# Patient Record
Sex: Female | Born: 1980
Health system: Southern US, Community
[De-identification: ages and names within clinical notes are randomized; demographics above are authoritative.]

---

## 2019-12-28 ENCOUNTER — Other Ambulatory Visit: Payer: Self-pay

## 2019-12-28 ENCOUNTER — Emergency Department (HOSPITAL_COMMUNITY)
Admission: EM | Admit: 2019-12-28 | Discharge: 2019-12-28 | Disposition: A | Discharge status: Home / Self Care | Payer: Self-pay | Attending: Emergency Medicine | Admitting: Emergency Medicine

## 2019-12-28 DIAGNOSIS — M791 Myalgia, unspecified site: Secondary | ICD-10-CM | POA: Insufficient documentation

## 2019-12-28 DIAGNOSIS — Z5321 Procedure and treatment not carried out due to patient leaving prior to being seen by health care provider: Secondary | ICD-10-CM | POA: Insufficient documentation

## 2019-12-28 NOTE — ED Triage Notes (Signed)
Pt here from home for evaluation of loss of taste and smell, sore throat, and generalized body aches x 2 days. Sister here with same symptoms.

## 2019-12-28 NOTE — ED Notes (Signed)
No response for room or reassessment x3

## 2021-03-14 ENCOUNTER — Emergency Department (HOSPITAL_COMMUNITY): Payer: Self-pay

## 2021-03-14 ENCOUNTER — Emergency Department (HOSPITAL_COMMUNITY)
Admission: EM | Admit: 2021-03-14 | Discharge: 2021-03-14 | Disposition: A | Discharge status: Home / Self Care | Payer: Self-pay | Attending: Emergency Medicine | Admitting: Emergency Medicine

## 2021-03-14 ENCOUNTER — Other Ambulatory Visit: Payer: Self-pay

## 2021-03-14 ENCOUNTER — Encounter (HOSPITAL_COMMUNITY): Payer: Self-pay | Admitting: Emergency Medicine

## 2021-03-14 DIAGNOSIS — R102 Pelvic and perineal pain: Secondary | ICD-10-CM | POA: Insufficient documentation

## 2021-03-14 DIAGNOSIS — N12 Tubulo-interstitial nephritis, not specified as acute or chronic: Secondary | ICD-10-CM | POA: Insufficient documentation

## 2021-03-14 DIAGNOSIS — M549 Dorsalgia, unspecified: Secondary | ICD-10-CM

## 2021-03-14 DIAGNOSIS — I722 Aneurysm of renal artery: Secondary | ICD-10-CM | POA: Insufficient documentation

## 2021-03-14 LAB — URINALYSIS, ROUTINE W REFLEX MICROSCOPIC
Bilirubin Urine: NEGATIVE
Glucose, UA: NEGATIVE mg/dL
Ketones, ur: NEGATIVE mg/dL
Nitrite: POSITIVE — AB
Protein, ur: NEGATIVE mg/dL
Specific Gravity, Urine: 1.023 (ref 1.005–1.030)
pH: 6 (ref 5.0–8.0)

## 2021-03-14 LAB — COMPREHENSIVE METABOLIC PANEL
ALT: 10 U/L (ref 0–44)
AST: 15 U/L (ref 15–41)
Albumin: 3.9 g/dL (ref 3.5–5.0)
Alkaline Phosphatase: 70 U/L (ref 38–126)
Anion gap: 8 (ref 5–15)
BUN: 14 mg/dL (ref 6–20)
CO2: 22 mmol/L (ref 22–32)
Calcium: 9.3 mg/dL (ref 8.9–10.3)
Chloride: 110 mmol/L (ref 98–111)
Creatinine, Ser: 0.81 mg/dL (ref 0.44–1.00)
GFR, Estimated: >60 mL/min (ref >60)
Glucose, Bld: 107 mg/dL — ABNORMAL HIGH (ref 70–99)
Potassium: 3.5 mmol/L (ref 3.5–5.1)
Sodium: 140 mmol/L (ref 135–145)
Total Bilirubin: 0.4 mg/dL (ref 0.3–1.2)
Total Protein: 7.6 g/dL (ref 6.5–8.1)

## 2021-03-14 LAB — CBC
HCT: 38.2 % (ref 36.0–46.0)
Hemoglobin: 12.6 g/dL (ref 12.0–15.0)
MCH: 27.8 pg (ref 26.0–34.0)
MCHC: 33 g/dL (ref 30.0–36.0)
MCV: 84.1 fL (ref 80.0–100.0)
Platelets: 203 10*3/uL (ref 150–400)
RBC: 4.54 MIL/uL (ref 3.87–5.11)
RDW: 14.1 % (ref 11.5–15.5)
WBC: 10.7 10*3/uL — ABNORMAL HIGH (ref 4.0–10.5)
nRBC: 0 % (ref 0.0–0.2)

## 2021-03-14 LAB — POC URINE PREG, ED: Preg Test, Ur: NEGATIVE

## 2021-03-14 LAB — LIPASE, BLOOD: Lipase: 30 U/L (ref 11–51)

## 2021-03-14 MED ORDER — ONDANSETRON 4 MG PO TBDP
4.0000 mg | ORAL_TABLET | Freq: Once | ORAL | Status: AC
  Administered 2021-03-14: 4 mg via ORAL
  Filled 2021-03-14: qty 1

## 2021-03-14 MED ORDER — LIDOCAINE HCL (PF) 1 % IJ SOLN
5.0000 mL | Freq: Once | INTRAMUSCULAR | Status: AC
  Administered 2021-03-14: 2.1 mL via INTRADERMAL
  Filled 2021-03-14: qty 30

## 2021-03-14 MED ORDER — CEFTRIAXONE SODIUM 1 G IJ SOLR
1.0000 g | Freq: Once | INTRAMUSCULAR | Status: AC
  Administered 2021-03-14: 1 g via INTRAMUSCULAR
  Filled 2021-03-14: qty 10

## 2021-03-14 MED ORDER — HYDROMORPHONE HCL 1 MG/ML IJ SOLN
1.0000 mg | Freq: Once | INTRAMUSCULAR | Status: AC
  Administered 2021-03-14: 1 mg via INTRAMUSCULAR
  Filled 2021-03-14: qty 1

## 2021-03-14 MED ORDER — CEPHALEXIN 500 MG PO CAPS: 500.0000 mg | ORAL_CAPSULE | Freq: Four times a day (QID) | ORAL | 0 refills | Status: DC

## 2021-03-14 NOTE — Discharge Instructions (Addendum)
1. Medications: Keflex, usual home medications °2. Treatment: rest, drink plenty of fluids, take medications as prescribed °3. Follow Up: Please followup with your primary doctor in 3 days for discussion of your diagnoses and further evaluation after today's visit; if you do not have a primary care doctor use the resource guide provided to find one; return to the ER for fevers, persistent vomiting, worsening abdominal pain or other concerning symptoms. ° °

## 2021-03-14 NOTE — ED Triage Notes (Signed)
Pt BIB EMS from work c/o generalized abdominal pain that radiates to right flank/lower back area x 2 days that has progressively gotten worse. Denies N/V/D, urinary changes.

## 2021-03-14 NOTE — ED Provider Notes (Signed)
[facility]-EMERGENCY DEPT Provider Note   CSN: [id number] Arrival date & time: 03/14/21  0105     History Chief Complaint  Patient presents with   Abdominal Pain    Courtney Gutierrez is a 40 y.o. female presents to the Emergency Department complaining of gradual, persistent, progressively worsening right sided back and flank pain onset 3 days ago.  Rates pain at a 10/10.  Pain radiates into the RLQ.  Movement makes the pain worse.  Denies N/V/D.  Pt denies hx of kidney stones. No urinary or vaginal symptoms.  No menses in 3 years.  No fever, chills.  No falls or known trauma.  No treatments prior to arrival.  The history is provided by the patient and medical records. No language interpreter was used.      History reviewed. No pertinent past medical history.  There are no problems to display for this patient.   Past Surgical History:  Procedure Laterality Date   CESAREAN SECTION       OB History   No obstetric history on file.     No family history on file.     Home Medications Prior to Admission medications   Medication Sig Start Date End Date Taking? Authorizing Provider  cephALEXin (KEFLEX) 500 MG capsule Take 1 capsule (500 mg total) by mouth 4 (four) times daily. 03/14/21  Yes Jared Whorley, Dahlia Client, PA-C    Allergies    Patient has no known allergies.  Review of Systems   Review of Systems  Constitutional:  Negative for appetite change, diaphoresis, fatigue, fever and unexpected weight change.  HENT:  Negative for mouth sores.   Eyes:  Negative for visual disturbance.  Respiratory:  Negative for cough, chest tightness, shortness of breath and wheezing.   Cardiovascular:  Negative for chest pain.  Gastrointestinal:  Positive for abdominal pain. Negative for constipation, diarrhea, nausea and vomiting.  Endocrine: Negative for polydipsia, polyphagia and polyuria.  Genitourinary:  Positive for flank pain. Negative for dysuria, frequency,  hematuria and urgency.  Musculoskeletal:  Positive for back pain. Negative for neck stiffness.  Skin:  Negative for rash.  Allergic/Immunologic: Negative for immunocompromised state.  Neurological:  Negative for syncope, light-headedness and headaches.  Hematological:  Does not bruise/bleed easily.  Psychiatric/Behavioral:  Negative for sleep disturbance. The patient is not nervous/anxious.    Physical Exam Updated Vital Signs BP (!) 135/106 (BP Location: Left Arm)   Pulse 72   Temp 98.5 F (36.9 C) (Oral)   Resp 18   SpO2 100%   Physical Exam Vitals and nursing note reviewed.  Constitutional:      General: She is not in acute distress.    Appearance: She is not diaphoretic.     Comments: Rocking in pain and tearful  HENT:     Head: Normocephalic.  Eyes:     General: No scleral icterus.    Conjunctiva/sclera: Conjunctivae normal.  Cardiovascular:     Rate and Rhythm: Normal rate and regular rhythm.     Pulses: Normal pulses.          Radial pulses are 2+ on the right side and 2+ on the left side.  Pulmonary:     Effort: No tachypnea, accessory muscle usage, prolonged expiration, respiratory distress or retractions.     Breath sounds: No stridor.     Comments: Equal chest rise. No increased work of breathing. Abdominal:     General: There is no distension.     Palpations: Abdomen is soft.  Tenderness: There is abdominal tenderness in the right lower quadrant and suprapubic area. There is right CVA tenderness and left CVA tenderness. There is no guarding or rebound.  Musculoskeletal:     Cervical back: Normal range of motion.     Thoracic back: Normal.     Lumbar back: Tenderness present.     Comments: Moves all extremities equally and without difficulty. Tenderness to palpation of the right paraspinal muscles of the L-spine  Skin:    General: Skin is warm and dry.     Capillary Refill: Capillary refill takes less than 2 seconds.  Neurological:     Mental Status:  She is alert.     GCS: GCS eye subscore is 4. GCS verbal subscore is 5. GCS motor subscore is 6.     Comments: Speech is clear and goal oriented.  Psychiatric:        Mood and Affect: Mood normal.    ED Results / Procedures / Treatments   Labs (all labs ordered are listed, but only abnormal results are displayed) Labs Reviewed  COMPREHENSIVE METABOLIC PANEL - Abnormal; Notable for the following components:      Result Value   Glucose, Bld 107 (*)    All other components within normal limits  CBC - Abnormal; Notable for the following components:   WBC 10.7 (*)    All other components within normal limits  URINALYSIS, ROUTINE W REFLEX MICROSCOPIC - Abnormal; Notable for the following components:   Hgb urine dipstick SMALL (*)    Nitrite POSITIVE (*)    Leukocytes,Ua MODERATE (*)    Bacteria, UA RARE (*)    All other components within normal limits  URINE CULTURE  LIPASE, BLOOD  I-STAT BETA HCG BLOOD, ED ([facility], [facility], [facility] ONLY)  POC URINE PREG, ED      Radiology CT L-SPINE NO CHARGE  Result Date: 03/14/2021 CLINICAL DATA:  Right flank and low back pain EXAM: CT LUMBAR SPINE WITHOUT CONTRAST TECHNIQUE: Multidetector CT imaging of the lumbar spine was performed without intravenous contrast administration. Multiplanar CT image reconstructions were also generated. COMPARISON:  None. FINDINGS: Segmentation: 5 lumbar type vertebrae. Alignment: Levoscoliosis with apex at L2 Vertebrae: No acute fracture or focal pathologic process. Disc levels: There is no bony spinal canal stenosis. There is moderate-to-severe facet arthrosis of the lower lumbar spine. Moderate to severe right L2 and L3 foraminal stenosis and left L4 and L5 foraminal stenosis. IMPRESSION: 1. No acute fracture or static subluxation of the lumbar spine. 2. Levoscoliosis with apex at L2. 3. Moderate to severe right L2, right L3, left L4 and left L5 neural foraminal stenosis. Electronically Signed   By: Deatra Robinson M.D.   On:  03/14/2021 03:32   CT Renal Stone Study  Result Date: 03/14/2021 CLINICAL DATA:  Right flank pain, low back pain EXAM: CT ABDOMEN AND PELVIS WITHOUT CONTRAST TECHNIQUE: Multidetector CT imaging of the abdomen and pelvis was performed following the standard protocol without IV contrast. COMPARISON:  None. FINDINGS: Lower chest: No acute abnormality. Hepatobiliary: No focal liver abnormality is seen. No gallstones, gallbladder wall thickening, or biliary dilatation. Pancreas: Unremarkable Spleen: Unremarkable Adrenals/Urinary Tract: The adrenal glands are unremarkable. The kidneys are normal in size and position. Rim calcified rounded structure within the left renal hilum may represent a a renal artery aneurysm measuring 12 mm in maximal diameter. The kidneys are otherwise unremarkable. Specifically, there is no hydronephrosis, no intrarenal or ureteral calculi are identified, and there is no significant perinephric stranding or  perinephric fluid collections. The bladder is unremarkable. Stomach/Bowel: The stomach, small bowel, and and large bowel are unremarkable. Appendix normal. No evidence of obstruction or focal inflammation. No free intraperitoneal gas or fluid. Vascular/Lymphatic: No significant vascular findings are present. No enlarged abdominal or pelvic lymph nodes. Reproductive: Uterus and bilateral adnexa are unremarkable. Other: No abdominal wall hernia.  Rectum unremarkable. Musculoskeletal: Advanced degenerative changes are seen within the lumbar spine with superimposed lumbar levoscoliosis, apex left at L3. No acute bone abnormality. No lytic or blastic bone lesion. IMPRESSION: No acute intra-abdominal pathology. No definite radiographic explanation for the patient's reported symptoms. 12 mm partially rim calcified structure within the left renal hilum possibly representing a renal artery aneurysm. This could be confirmed with nonemergent CT arteriography. Lumbar levoscoliosis with superimposed  advanced degenerative change. Electronically Signed   By: Helyn Numbers MD   On: 03/14/2021 03:33    Procedures Procedures   Medications Ordered in ED Medications  HYDROmorphone (DILAUDID) injection 1 mg (1 mg Intramuscular Given 03/14/21 0229)  ondansetron (ZOFRAN-ODT) disintegrating tablet 4 mg (4 mg Oral Given 03/14/21 0230)  cefTRIAXone (ROCEPHIN) injection 1 g (1 g Intramuscular Given 03/14/21 0524)  lidocaine (PF) (XYLOCAINE) 1 % injection 5 mL (2.1 mLs Intradermal Given 03/14/21 0524)    ED Course  I have reviewed the triage vital signs and the nursing notes.  Pertinent labs & imaging results that were available during my care of the patient were reviewed by me and considered in my medical decision making (see chart for details).    MDM Rules/Calculators/A&P                           Patient presents with right-sided flank and lower abdominal pain.  Patient was severe pain, tearful and rocking.  Labs and imaging pending.  Suspect nephrolithiasis.  Patient has no history of same.  Denies urinary symptoms.  Work-up with mild leukocytosis.  Urinalysis consistent with urinary tract infection.  CT scan without evidence of nephrolithiasis.  Urine culture sent.  Patient given Rocephin here in the emergency department and discharged home with Keflex.  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  Patient referred to urology for further evaluation and additional work-up.  Patient states understanding and is in agreement with this plan.  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

## 2021-03-15 ENCOUNTER — Encounter (HOSPITAL_COMMUNITY): Payer: Self-pay | Admitting: Emergency Medicine

## 2021-03-15 ENCOUNTER — Other Ambulatory Visit: Payer: Self-pay

## 2021-03-15 ENCOUNTER — Emergency Department (HOSPITAL_COMMUNITY)
Admission: EM | Admit: 2021-03-15 | Discharge: 2021-03-15 | Disposition: A | Discharge status: Home / Self Care | Payer: Self-pay | Attending: Student | Admitting: Student

## 2021-03-15 DIAGNOSIS — R109 Unspecified abdominal pain: Secondary | ICD-10-CM | POA: Insufficient documentation

## 2021-03-15 DIAGNOSIS — N12 Tubulo-interstitial nephritis, not specified as acute or chronic: Secondary | ICD-10-CM

## 2021-03-15 LAB — URINALYSIS, ROUTINE W REFLEX MICROSCOPIC
Bilirubin Urine: NEGATIVE
Glucose, UA: NEGATIVE mg/dL
Hgb urine dipstick: NEGATIVE
Ketones, ur: 5 mg/dL — AB
Nitrite: NEGATIVE
Protein, ur: 30 mg/dL — AB
Specific Gravity, Urine: 1.021 (ref 1.005–1.030)
Squamous Epithelial / HPF: >50 — ABNORMAL HIGH (ref 0–5)
pH: 5 (ref 5.0–8.0)

## 2021-03-15 LAB — CBC WITH DIFFERENTIAL/PLATELET
Abs Immature Granulocytes: 0.03 10*3/uL (ref 0.00–0.07)
Basophils Absolute: 0 10*3/uL (ref 0.0–0.1)
Basophils Relative: 0 %
Eosinophils Absolute: 0.2 10*3/uL (ref 0.0–0.5)
Eosinophils Relative: 2 %
HCT: 39.8 % (ref 36.0–46.0)
Hemoglobin: 12.8 g/dL (ref 12.0–15.0)
Immature Granulocytes: 0 %
Lymphocytes Relative: 34 %
Lymphs Abs: 2.9 10*3/uL (ref 0.7–4.0)
MCH: 27.8 pg (ref 26.0–34.0)
MCHC: 32.2 g/dL (ref 30.0–36.0)
MCV: 86.5 fL (ref 80.0–100.0)
Monocytes Absolute: 0.7 10*3/uL (ref 0.1–1.0)
Monocytes Relative: 8 %
Neutro Abs: 4.8 10*3/uL (ref 1.7–7.7)
Neutrophils Relative %: 56 %
Platelets: 206 10*3/uL (ref 150–400)
RBC: 4.6 MIL/uL (ref 3.87–5.11)
RDW: 14.2 % (ref 11.5–15.5)
WBC: 8.7 10*3/uL (ref 4.0–10.5)
nRBC: 0 % (ref 0.0–0.2)

## 2021-03-15 LAB — COMPREHENSIVE METABOLIC PANEL
ALT: 11 U/L (ref 0–44)
AST: 18 U/L (ref 15–41)
Albumin: 3.7 g/dL (ref 3.5–5.0)
Alkaline Phosphatase: 69 U/L (ref 38–126)
Anion gap: 8 (ref 5–15)
BUN: 10 mg/dL (ref 6–20)
CO2: 26 mmol/L (ref 22–32)
Calcium: 8.8 mg/dL — ABNORMAL LOW (ref 8.9–10.3)
Chloride: 107 mmol/L (ref 98–111)
Creatinine, Ser: 0.65 mg/dL (ref 0.44–1.00)
GFR, Estimated: >60 mL/min (ref >60)
Glucose, Bld: 82 mg/dL (ref 70–99)
Potassium: 3.7 mmol/L (ref 3.5–5.1)
Sodium: 141 mmol/L (ref 135–145)
Total Bilirubin: 0.5 mg/dL (ref 0.3–1.2)
Total Protein: 7.5 g/dL (ref 6.5–8.1)

## 2021-03-15 LAB — LIPASE, BLOOD: Lipase: 32 U/L (ref 11–51)

## 2021-03-15 MED ORDER — DICLOFENAC SODIUM 1 % EX GEL
2.0000 g | Freq: Four times a day (QID) | CUTANEOUS | Status: DC
Start: 2021-03-15 — End: 2022-01-09

## 2021-03-15 MED ORDER — KETOROLAC TROMETHAMINE 15 MG/ML IJ SOLN
15.0000 mg | Freq: Once | INTRAMUSCULAR | Status: AC
  Administered 2021-03-15: 15 mg via INTRAVENOUS
  Filled 2021-03-15: qty 1

## 2021-03-15 MED ORDER — ACETAMINOPHEN 325 MG PO TABS
650.0000 mg | ORAL_TABLET | Freq: Once | ORAL | Status: AC
  Administered 2021-03-15: 650 mg via ORAL
  Filled 2021-03-15: qty 2

## 2021-03-15 MED ORDER — LIDOCAINE 5 % EX PTCH
1.0000 | MEDICATED_PATCH | CUTANEOUS | Status: DC
  Administered 2021-03-15: 1 via TRANSDERMAL
  Filled 2021-03-15: qty 1

## 2021-03-15 NOTE — ED Provider Notes (Signed)
[facility]-EMERGENCY DEPT Provider Note   CSN: [id number] Arrival date & time: 03/15/21  1719     History Chief Complaint  Patient presents with   Flank Pain    Courtney Gutierrez is a 40 y.o. female who presents to the emergency department for evaluation of right flank pain.  Patient was seen in the emergency department yesterday and had an extensive work-up revealing pyelonephritis is the source of her flank pain with a negative CT stone study.  She states that she has been taking her outpatient antibiotics and has not had any recurrent fever, nausea, vomiting, chest pain, shortness of breath or other systemic symptoms.  She states that her pain is in the right flank and returns due to uncontrolled pain.   Flank Pain Pertinent negatives include no chest pain, no abdominal pain and no shortness of breath.      History reviewed. No pertinent past medical history.  There are no problems to display for this patient.   Past Surgical History:  Procedure Laterality Date   CESAREAN SECTION       OB History   No obstetric history on file.     No family history on file.     Home Medications Prior to Admission medications   Medication Sig Start Date End Date Taking? Authorizing Provider  cephALEXin (KEFLEX) 500 MG capsule Take 1 capsule (500 mg total) by mouth 4 (four) times daily. 03/14/21   Muthersbaugh, Dahlia Client, PA-C    Allergies    Patient has no known allergies.  Review of Systems   Review of Systems  Constitutional:  Negative for chills and fever.  HENT:  Negative for ear pain and sore throat.   Eyes:  Negative for pain and visual disturbance.  Respiratory:  Negative for cough and shortness of breath.   Cardiovascular:  Negative for chest pain and palpitations.  Gastrointestinal:  Negative for abdominal pain and vomiting.  Genitourinary:  Positive for flank pain. Negative for dysuria and hematuria.  Musculoskeletal:  Negative for arthralgias and  back pain.  Skin:  Negative for color change and rash.  Neurological:  Negative for seizures and syncope.  All other systems reviewed and are negative.  Physical Exam Updated Vital Signs BP (!) 175/104   Pulse (!) 55   Temp 98.6 F (37 C) (Oral)   Resp 18   SpO2 100%   Physical Exam  ED Results / Procedures / Treatments   Labs (all labs ordered are listed, but only abnormal results are displayed) Labs Reviewed  COMPREHENSIVE METABOLIC PANEL - Abnormal; Notable for the following components:      Result Value   Calcium 8.8 (*)    All other components within normal limits  URINALYSIS, ROUTINE W REFLEX MICROSCOPIC - Abnormal; Notable for the following components:   APPearance TURBID (*)    Ketones, ur 5 (*)    Protein, ur 30 (*)    Leukocytes,Ua MODERATE (*)    Bacteria, UA MANY (*)    Squamous Epithelial / LPF >50 (*)    All other components within normal limits  CBC WITH DIFFERENTIAL/PLATELET  LIPASE, BLOOD    EKG None  Radiology CT L-SPINE NO CHARGE  Result Date: 03/14/2021 CLINICAL DATA:  Right flank and low back pain EXAM: CT LUMBAR SPINE WITHOUT CONTRAST TECHNIQUE: Multidetector CT imaging of the lumbar spine was performed without intravenous contrast administration. Multiplanar CT image reconstructions were also generated. COMPARISON:  None. FINDINGS: Segmentation: 5 lumbar type vertebrae. Alignment: Levoscoliosis with apex  at L2 Vertebrae: No acute fracture or focal pathologic process. Disc levels: There is no bony spinal canal stenosis. There is moderate-to-severe facet arthrosis of the lower lumbar spine. Moderate to severe right L2 and L3 foraminal stenosis and left L4 and L5 foraminal stenosis. IMPRESSION: 1. No acute fracture or static subluxation of the lumbar spine. 2. Levoscoliosis with apex at L2. 3. Moderate to severe right L2, right L3, left L4 and left L5 neural foraminal stenosis. Electronically Signed   By: Deatra Robinson M.D.   On: 03/14/2021 03:32   CT  Renal Stone Study  Result Date: 03/14/2021 CLINICAL DATA:  Right flank pain, low back pain EXAM: CT ABDOMEN AND PELVIS WITHOUT CONTRAST TECHNIQUE: Multidetector CT imaging of the abdomen and pelvis was performed following the standard protocol without IV contrast. COMPARISON:  None. FINDINGS: Lower chest: No acute abnormality. Hepatobiliary: No focal liver abnormality is seen. No gallstones, gallbladder wall thickening, or biliary dilatation. Pancreas: Unremarkable Spleen: Unremarkable Adrenals/Urinary Tract: The adrenal glands are unremarkable. The kidneys are normal in size and position. Rim calcified rounded structure within the left renal hilum may represent a a renal artery aneurysm measuring 12 mm in maximal diameter. The kidneys are otherwise unremarkable. Specifically, there is no hydronephrosis, no intrarenal or ureteral calculi are identified, and there is no significant perinephric stranding or perinephric fluid collections. The bladder is unremarkable. Stomach/Bowel: The stomach, small bowel, and and large bowel are unremarkable. Appendix normal. No evidence of obstruction or focal inflammation. No free intraperitoneal gas or fluid. Vascular/Lymphatic: No significant vascular findings are present. No enlarged abdominal or pelvic lymph nodes. Reproductive: Uterus and bilateral adnexa are unremarkable. Other: No abdominal wall hernia.  Rectum unremarkable. Musculoskeletal: Advanced degenerative changes are seen within the lumbar spine with superimposed lumbar levoscoliosis, apex left at L3. No acute bone abnormality. No lytic or blastic bone lesion. IMPRESSION: No acute intra-abdominal pathology. No definite radiographic explanation for the patient's reported symptoms. 12 mm partially rim calcified structure within the left renal hilum possibly representing a renal artery aneurysm. This could be confirmed with nonemergent CT arteriography. Lumbar levoscoliosis with superimposed advanced degenerative  change. Electronically Signed   By: Helyn Numbers MD   On: 03/14/2021 03:33    Procedures Procedures   Medications Ordered in ED Medications  lidocaine (LIDODERM) 5 % 1 patch (has no administration in time range)  ketorolac (TORADOL) 15 MG/ML injection 15 mg (has no administration in time range)  acetaminophen (TYLENOL) tablet 650 mg (650 mg Oral Given 03/15/21 1809)    ED Course  I have reviewed the triage vital signs and the nursing notes.  Pertinent labs & imaging results that were available during my care of the patient were reviewed by me and considered in my medical decision making (see chart for details).    MDM Rules/Calculators/A&P                           Patient seen the emergency department for evaluation of flank pain.  Physical exam reveals tenderness over the right flank but is otherwise unremarkable.  Laboratory evaluation again reveals moderate leuk esterase with 21-50 white blood cells, 21-50 red blood cells and many bacteria, greater than 50 squamous epithelial cells.  Laboratory evaluation otherwise unremarkable.  Patient was given Toradol and lidocaine patch.  On reevaluation, patient's pain well controlled and she was discharged with instructions to take NSAID therapy scheduled 3 times daily, purchase lidocaine patches and Voltaren gel from the pharmacy.  She was given return precautions of which she voiced understanding and she was discharged. Final Clinical Impression(s) / ED Diagnoses Final diagnoses:  None    Rx / DC Orders ED Discharge Orders     None        Tyrus Wilms, Wyn Forster, MD 03/15/21 2328

## 2021-03-15 NOTE — ED Provider Notes (Signed)
Emergency Medicine Provider Triage Evaluation Note  Courtney Gutierrez , a 40 y.o. female  was evaluated in triage.  Pt complains of right flank pain.  Pain has been present over the last 4 days.  Patient went to emergency department yesterday and had a full work-up.  Patient denies any change in pain from yesterday.  Patient reports taking Advil earlier today with no relief of symptoms.  Patient denies any fevers, chills, nausea, vomiting, dysuria, hematuria, vaginal pain, vaginal bleeding, vaginal discharge.  Patient denies any recent falls or injuries.  LMP 4 years prior.  Review of Systems  Positive: Right flank pain, urinary frequency Negative: vers, chills, nausea, vomiting, dysuria, hematuria, vaginal pain, vaginal bleeding, vaginal discharge  Physical Exam  BP 130/81 (BP Location: Left Arm)   Pulse 60   Temp 98.3 F (36.8 C) (Oral)   Resp 16   SpO2 98%  Gen:   Awake, no distress   Resp:  Normal effort  MSK:   Moves extremities without difficulty  Other:  Abdomen soft, nondistended, nontender, no guarding, no rebound tenderness.  Positive right CVA tenderness.  Medical Decision Making  Medically screening exam initiated at 5:42 PM.  Appropriate orders placed.  Courtney Gutierrez was informed that the remainder of the evaluation will be completed by another provider, this initial triage assessment does not replace that evaluation, and the importance of remaining in the ED until their evaluation is complete.  The patient appears stable so that the remainder of the work up may be completed by another provider.      Haskel Schroeder, PA-C 03/15/21 1744    Glendora Score, MD 03/15/21 [id number]

## 2021-03-15 NOTE — ED Triage Notes (Signed)
Pt c/o flank pain and UTI. Pt was seen yesterday in ED for same and was discharged home with abx. Pt reports she returned to ED bc pain has gotten worse.

## 2021-03-15 NOTE — Discharge Instructions (Addendum)
You were seen in the emergency department for evaluation of flank pain.  Your flank pain is due to a kidney infection called pyelonephritis.  Please continue to take your antibiotics as scheduled that you received yesterday.  I will give you a prescription for a topical medicine called Voltaren gel that you can rub over the area of maximal pain.  Another option will be to use a lidocaine patch called Salonpas that you can pick up over-the-counter at any local pharmacy.  Please take 600 mg ibuprofen with breakfast lunch and dinner for the next 4 days scheduled.  Please take the medication even if you are not in pain.  Return the emergency department if you have new or worsening fevers, vomiting, chest pain, shortness of breath, abdominal pain, blood in your urine or any other concerning symptoms.

## 2021-03-15 NOTE — ED Notes (Signed)
Pt unable to urinate at this time.  

## 2021-03-16 LAB — URINE CULTURE: Culture: >=100000 — AB

## 2021-03-17 ENCOUNTER — Telehealth: Payer: Self-pay | Admitting: Emergency Medicine

## 2021-03-17 NOTE — Telephone Encounter (Signed)
Post ED Visit - Positive Culture Follow-up  Culture report reviewed by antimicrobial stewardship pharmacist: [facility] []  , Pharm.D. []  Enzo Bi, Pharm.D., BCPS AQ-ID []  , Pharm.D., BCPS []  Celedonio Miyamoto, Pharm.D., BCPS []  [place], Garvin Fila.D., BCPS, AAHIVP []  , Pharm.D., BCPS, AAHIVP []  Georgina Pillion, PharmD, BCPS []  , PharmD, BCPS []  [place], PharmD, BCPS []  [address], PharmD []  , PharmD, BCPS []  Estella Husk, PharmD  Pharmacy Team []  Lysle Pearl, PharmD []  , PharmD []  Phillips Climes, PharmD []  , Rph []  Agapito Games) , PharmD []  Verlan Friends, PharmD []  , PharmD []  Mervyn Gay, PharmD []  , PharmD []  Vinnie Level, PharmD []  [facility], PharmD []  , PharmD []  Len Childs, PharmD   Positive urine culture Treated with rocephin and keflex, organism sensitive to the same and no further patient follow-up is required at this time.  03/17/2021, 9:27 AM

## 2021-03-25 ENCOUNTER — Ambulatory Visit: Payer: Self-pay | Admitting: Nurse Practitioner

## 2021-03-25 ENCOUNTER — Ambulatory Visit: Payer: Self-pay

## 2021-04-08 ENCOUNTER — Emergency Department (HOSPITAL_COMMUNITY)
Admission: EM | Admit: 2021-04-08 | Discharge: 2021-04-08 | Disposition: A | Discharge status: Home / Self Care | Payer: Self-pay | Attending: Student | Admitting: Student

## 2021-04-08 ENCOUNTER — Emergency Department (HOSPITAL_COMMUNITY): Payer: Self-pay

## 2021-04-08 ENCOUNTER — Other Ambulatory Visit: Payer: Self-pay

## 2021-04-08 DIAGNOSIS — M1712 Unilateral primary osteoarthritis, left knee: Secondary | ICD-10-CM | POA: Insufficient documentation

## 2021-04-08 MED ORDER — CELECOXIB 200 MG PO CAPS: 200.0000 mg | ORAL_CAPSULE | Freq: Two times a day (BID) | ORAL | 0 refills | Status: DC

## 2021-04-08 NOTE — ED Provider Notes (Signed)
[facility]-EMERGENCY DEPT Provider Note   CSN: [id number] Arrival date & time: 04/08/21  1918     History Chief Complaint  Patient presents with   Knee Pain    Courtney Gutierrez is a 40 y.o. female.  Who presents emergency department chief complaint of left knee pain.  Patient states that she has been having problems with her knee on and off since she was in high school.  She complains of crunching sounds in her knee.  She has pain on the lateral side which is worse with standing for long periods of time.  She is not been taking anything for pain.  She denies any recent injuries or swelling.  Her pain is increased over the last few months   Knee Pain     No past medical history on file.  There are no problems to display for this patient.   Past Surgical History:  Procedure Laterality Date   CESAREAN SECTION       OB History   No obstetric history on file.     No family history on file.     Home Medications Prior to Admission medications   Medication Sig Start Date End Date Taking? Authorizing Provider  cephALEXin (KEFLEX) 500 MG capsule Take 1 capsule (500 mg total) by mouth 4 (four) times daily. 03/14/21   Muthersbaugh, Dahlia Client, PA-C  diclofenac Sodium (VOLTAREN) 1 % GEL Apply 2 g topically 4 (four) times daily. 03/15/21   Kommor, Wyn Forster, MD    Allergies    Patient has no known allergies.  Review of Systems   Review of Systems  Musculoskeletal:  Positive for arthralgias.  Neurological:  Negative for weakness and numbness.   Physical Exam Updated Vital Signs BP 128/83 (BP Location: Left Arm)   Pulse 64   Temp 98.5 F (36.9 C) (Oral)   Resp 18   Ht 5\' 6"  (1.676 m)   Wt 118.8 kg   LMP  (LMP Unknown)   SpO2 100%   BMI 42.29 kg/m   Physical Exam Vitals and nursing note reviewed.  Constitutional:      General: She is not in acute distress.    Appearance: She is well-developed. She is not diaphoretic.  HENT:     Head: Normocephalic and  atraumatic.     Right Ear: External ear normal.     Left Ear: External ear normal.     Nose: Nose normal.     Mouth/Throat:     Mouth: Mucous membranes are moist.  Eyes:     General: No scleral icterus.    Conjunctiva/sclera: Conjunctivae normal.  Cardiovascular:     Rate and Rhythm: Normal rate and regular rhythm.     Heart sounds: Normal heart sounds. No murmur heard.   No friction rub. No gallop.  Pulmonary:     Effort: Pulmonary effort is normal. No respiratory distress.     Breath sounds: Normal breath sounds.  Abdominal:     General: Bowel sounds are normal. There is no distension.     Palpations: Abdomen is soft. There is no mass.     Tenderness: There is no abdominal tenderness. There is no guarding.  Musculoskeletal:     Cervical back: Normal range of motion.     Comments: Knee with bilateral valgus deformity.  She has crepitus with flexion extension.  Pain is worse with full extension of the knee.  She has some laxity over the lateral collateral ligament without obvious sprain or strain.  Skin:  General: Skin is warm and dry.  Neurological:     Mental Status: She is alert and oriented to person, place, and time.  Psychiatric:        Behavior: Behavior normal.    ED Results / Procedures / Treatments   Labs (all labs ordered are listed, but only abnormal results are displayed) Labs Reviewed - No data to display  EKG None  Radiology No results found.  Procedures Procedures   Medications Ordered in ED Medications - No data to display  ED Course  I have reviewed the triage vital signs and the nursing notes.  Pertinent labs & imaging results that were available during my care of the patient were reviewed by me and considered in my medical decision making (see chart for details).    MDM Rules/Calculators/A&P                          Patient X-Ray negative for obvious fracture or dislocation.  Pt advised to follow up with orthopedics if symptoms persist  for possibility of missed fracture diagnosis.Conservative therapy recommended and discussed. Patient will be dc home & is agreeable with above plan.  Final Clinical Impression(s) / ED Diagnoses Final diagnoses:  Osteoarthritis of left knee, unspecified osteoarthritis type    Rx / DC Orders ED Discharge Orders     None        Arthor Captain, PA-C 04/08/21 2300    Kommor, Wyn Forster, MD 04/09/21 [phone]

## 2021-04-08 NOTE — Discharge Instructions (Addendum)
Contact a health care provider if: °You have redness, swelling, or a feeling of warmth in a joint that gets worse. °You have a fever along with joint or muscle aches. °You develop a rash. °You have trouble doing your normal activities. °Get help right away if: °You have pain that gets worse and is not relieved by pain medicine. °

## 2021-04-08 NOTE — ED Triage Notes (Signed)
Pt complains of left knee pain for several months.

## 2021-06-18 ENCOUNTER — Encounter (HOSPITAL_COMMUNITY): Payer: Self-pay

## 2021-06-18 ENCOUNTER — Other Ambulatory Visit: Payer: Self-pay

## 2021-06-18 ENCOUNTER — Emergency Department (HOSPITAL_COMMUNITY)
Admission: EM | Admit: 2021-06-18 | Discharge: 2021-06-18 | Disposition: A | Discharge status: Home / Self Care | Payer: Self-pay | Attending: Emergency Medicine | Admitting: Emergency Medicine

## 2021-06-18 DIAGNOSIS — K644 Residual hemorrhoidal skin tags: Secondary | ICD-10-CM | POA: Insufficient documentation

## 2021-06-18 DIAGNOSIS — Z87891 Personal history of nicotine dependence: Secondary | ICD-10-CM | POA: Insufficient documentation

## 2021-06-18 DIAGNOSIS — K5900 Constipation, unspecified: Secondary | ICD-10-CM | POA: Insufficient documentation

## 2021-06-18 MED ORDER — HYDROCORTISONE ACETATE 25 MG RE SUPP: 25.0000 mg | Freq: Two times a day (BID) | RECTAL | 0 refills | Status: DC

## 2021-06-18 MED ORDER — POLYETHYLENE GLYCOL 3350 17 G PO PACK: 17.0000 g | PACK | Freq: Every day | ORAL | 0 refills | Status: DC

## 2021-06-18 MED ORDER — DOCUSATE SODIUM 100 MG PO CAPS: 100.0000 mg | ORAL_CAPSULE | Freq: Two times a day (BID) | ORAL | 0 refills | Status: DC

## 2021-06-18 NOTE — Discharge Instructions (Addendum)
Please refer to the attached instructions. Follow-up with gastroenterology.

## 2021-06-18 NOTE — ED Notes (Signed)
Pt verbalized understanding of d/c instructions, meds, and followup care. Denies questions. VSS, no distress noted. Steady gait to exit with all belongings. Work note provided.

## 2021-06-18 NOTE — ED Notes (Signed)
Pt moved into rm 46 for rectal exam per NP request

## 2021-06-18 NOTE — ED Triage Notes (Signed)
Pt reports difficulty moving her bowels and that she thinks she has hemorrhoids. Pt reports straining a lot while trying to have a BM. Pt reports unsuccessful treatment w/ stool softeners. LBM 06-16-21. Pt in NAD at this time.

## 2021-06-18 NOTE — ED Provider Notes (Signed)
[facility] EMERGENCY DEPARTMENT Provider Note   CSN: [id number] Arrival date & time: 06/18/21  1531     History Chief Complaint  Patient presents with   Constipation    Courtney Gutierrez is a 40 y.o. female.  Patient presents with constipation x 2 days, not responding to stool softeners. She has been straining to go, and has developed rectal pain. No abdominal pain, fever, chills.  The history is provided by the patient. No language interpreter was used.  Constipation Severity:  Moderate Time since last bowel movement:  2 days Chronicity:  New Stool description:  Hard Unusual stool frequency:  Daily Ineffective treatments:  Stool softeners Associated symptoms: no abdominal pain, no back pain, no fever, no nausea and no urinary retention   Risk factors: no recent antibiotic use and no recent surgery       History reviewed. No pertinent past medical history.  There are no problems to display for this patient.   Past Surgical History:  Procedure Laterality Date   CESAREAN SECTION       OB History   No obstetric history on file.     History reviewed. No pertinent family history.  Social History   Tobacco Use   Smoking status: Former    Types: Cigarettes   Smokeless tobacco: Never  Vaping Use   Vaping Use: Never used  Substance Use Topics   Alcohol use: Yes    Comment: occasionally   Drug use: Never    Home Medications Prior to Admission medications   Medication Sig Start Date End Date Taking? Authorizing Provider  celecoxib (CELEBREX) 200 MG capsule Take 1 capsule (200 mg total) by mouth 2 (two) times daily. 04/08/21   Harris, Abigail, PA-C  cephALEXin (KEFLEX) 500 MG capsule Take 1 capsule (500 mg total) by mouth 4 (four) times daily. 03/14/21   Muthersbaugh, Jarrett Soho, PA-C  diclofenac Sodium (VOLTAREN) 1 % GEL Apply 2 g topically 4 (four) times daily. 03/15/21   Kommor, Debe Coder, MD    Allergies    Patient has no known allergies.  Review  of Systems   Review of Systems  Constitutional:  Negative for fever.  Gastrointestinal:  Positive for constipation and rectal pain. Negative for abdominal pain and nausea.  Musculoskeletal:  Negative for back pain.  All other systems reviewed and are negative.  Physical Exam Updated Vital Signs BP (!) 165/102 (BP Location: Right Arm)   Pulse 72   Temp 98.3 F (36.8 C) (Oral)   Resp 18   Ht 5\' 6"  (1.676 m)   Wt 116.6 kg   SpO2 100%   BMI 41.48 kg/m   Physical Exam Constitutional:      Appearance: Normal appearance.  HENT:     Head: Normocephalic.     Mouth/Throat:     Mouth: Mucous membranes are moist.  Eyes:     Conjunctiva/sclera: Conjunctivae normal.  Cardiovascular:     Rate and Rhythm: Normal rate.  Pulmonary:     Effort: Pulmonary effort is normal.  Abdominal:     Palpations: Abdomen is soft.  Genitourinary:    Adnexa:        Right: Tenderness present.      Rectum: External hemorrhoid present. No anal fissure.  Musculoskeletal:        General: Normal range of motion.  Skin:    General: Skin is warm and dry.  Neurological:     Mental Status: She is alert and oriented to person, place, and time.  Psychiatric:  Mood and Affect: Mood normal.        Behavior: Behavior normal.    ED Results / Procedures / Treatments   Labs (all labs ordered are listed, but only abnormal results are displayed) Labs Reviewed - No data to display  EKG None  Radiology No results found.  Procedures Procedures   Medications Ordered in ED Medications - No data to display  ED Course  I have reviewed the triage vital signs and the nursing notes.  Pertinent labs & imaging results that were available during my care of the patient were reviewed by me and considered in my medical decision making (see chart for details).    MDM Rules/Calculators/A&P                           Patient presents with symptoms consistent with constipation. Low suspicion for chronic  causes of constipation including hypothyroidism or electrolyte disturbances. Presentation not consistent with other acute, emergent causes of constipation at this time. Final Clinical Impression(s) / ED Diagnoses Final diagnoses:  Constipation, unspecified constipation type  External hemorrhoid    Rx / DC Orders ED Discharge Orders          Ordered    hydrocortisone (ANUSOL-HC) 25 MG suppository  2 times daily        06/18/21 1913    docusate sodium (COLACE) 100 MG capsule  Every 12 hours        06/18/21 1913    polyethylene glycol (MIRALAX / GLYCOLAX) 17 g packet  Daily        06/18/21 1913              Felicie Morn, NP 06/18/21 1922    Melene Plan, DO 06/19/21 1507

## 2021-09-09 ENCOUNTER — Other Ambulatory Visit: Payer: Self-pay

## 2021-09-09 ENCOUNTER — Emergency Department (HOSPITAL_COMMUNITY)
Admission: EM | Admit: 2021-09-09 | Discharge: 2021-09-09 | Disposition: A | Discharge status: Home / Self Care | Payer: Self-pay | Attending: Emergency Medicine | Admitting: Emergency Medicine

## 2021-09-09 DIAGNOSIS — R1031 Right lower quadrant pain: Secondary | ICD-10-CM | POA: Insufficient documentation

## 2021-09-09 DIAGNOSIS — R110 Nausea: Secondary | ICD-10-CM | POA: Insufficient documentation

## 2021-09-09 DIAGNOSIS — M25562 Pain in left knee: Secondary | ICD-10-CM | POA: Insufficient documentation

## 2021-09-09 DIAGNOSIS — R109 Unspecified abdominal pain: Secondary | ICD-10-CM

## 2021-09-09 DIAGNOSIS — G8929 Other chronic pain: Secondary | ICD-10-CM | POA: Insufficient documentation

## 2021-09-09 LAB — URINALYSIS, ROUTINE W REFLEX MICROSCOPIC
Bilirubin Urine: NEGATIVE
Glucose, UA: NEGATIVE mg/dL
Hgb urine dipstick: NEGATIVE
Ketones, ur: NEGATIVE mg/dL
Nitrite: NEGATIVE
Protein, ur: NEGATIVE mg/dL
Specific Gravity, Urine: 1.025 (ref 1.005–1.030)
pH: 6 (ref 5.0–8.0)

## 2021-09-09 LAB — COMPREHENSIVE METABOLIC PANEL
ALT: 8 U/L (ref 0–44)
AST: 18 U/L (ref 15–41)
Albumin: 3.5 g/dL (ref 3.5–5.0)
Alkaline Phosphatase: 72 U/L (ref 38–126)
Anion gap: 11 (ref 5–15)
BUN: 9 mg/dL (ref 6–20)
CO2: 24 mmol/L (ref 22–32)
Calcium: 9 mg/dL (ref 8.9–10.3)
Chloride: 105 mmol/L (ref 98–111)
Creatinine, Ser: 0.86 mg/dL (ref 0.44–1.00)
GFR, Estimated: >60 mL/min (ref >60)
Glucose, Bld: 95 mg/dL (ref 70–99)
Potassium: 3.6 mmol/L (ref 3.5–5.1)
Sodium: 140 mmol/L (ref 135–145)
Total Bilirubin: 0.5 mg/dL (ref 0.3–1.2)
Total Protein: 7 g/dL (ref 6.5–8.1)

## 2021-09-09 LAB — CBC
HCT: 37.9 % (ref 36.0–46.0)
Hemoglobin: 12 g/dL (ref 12.0–15.0)
MCH: 27.1 pg (ref 26.0–34.0)
MCHC: 31.7 g/dL (ref 30.0–36.0)
MCV: 85.6 fL (ref 80.0–100.0)
Platelets: 209 10*3/uL (ref 150–400)
RBC: 4.43 MIL/uL (ref 3.87–5.11)
RDW: 13.6 % (ref 11.5–15.5)
WBC: 8.3 10*3/uL (ref 4.0–10.5)
nRBC: 0 % (ref 0.0–0.2)

## 2021-09-09 LAB — URINALYSIS, MICROSCOPIC (REFLEX)

## 2021-09-09 LAB — I-STAT BETA HCG BLOOD, ED (MC, WL, AP ONLY): I-stat hCG, quantitative: <5 m[IU]/mL (ref <5)

## 2021-09-09 LAB — LIPASE, BLOOD: Lipase: 32 U/L (ref 11–51)

## 2021-09-09 MED ORDER — DICYCLOMINE HCL 20 MG PO TABS: 20.0000 mg | ORAL_TABLET | Freq: Two times a day (BID) | ORAL | 0 refills | Status: DC

## 2021-09-09 MED ORDER — ONDANSETRON 4 MG PO TBDP: 4.0000 mg | ORAL_TABLET | Freq: Three times a day (TID) | ORAL | 0 refills | Status: DC | PRN

## 2021-09-09 NOTE — ED Provider Notes (Signed)
[facility] EMERGENCY DEPARTMENT Provider Note   CSN: [id number] Arrival date & time: 09/09/21  0845     History  Chief Complaint  Patient presents with   Abdominal Pain    Courtney Gutierrez is a 41 y.o. female.  HPI     41 year old female presents with concern for abdominal pain, and left leg pain.  Abdominal pain has been going on for approximately 2 months but worsening over the last 2 weeks.  Reports the pain is generalized, but primarily located on the right lower side.  Its an aching pain, nothing seems to make it better or worse.  It seems to come and go.  she has not had a menstrual period in 2 months, wonders if she could be pregnant.  Reports that she took a urine pregnancy test at home that was negative.  Reports that with 2 of her children, she had initially had negative pregnancy test and did not find out she was pregnant until she is approximately 6 months pregnant and they had to do this via ultrasound.    Reports that she has had nausea, but no vomiting. It has been coming and going but not associated with anything. Denies vaginal bleeding or discharge, dysuria.  Reports that she has had diarrhea over the last couple days.  Denies cough, chest pain, shortness of breath, fevers.  Reports that she has had low appetite at baseline.  Reports that she has had pain in her left leg for a long time, possibly years.  Reports that the pain goes from her left knee, radiating upwards, and at times rating downwards.  She has not had any acute injuries.  Has not had asymmetric swelling.  Thinks it is because she has not needed.  On review of notes she had presented with this knee pain in August and had an x-ray completed which showed no acute findings.  Was evaluated for abdominal pain in August with right-sided symptoms.  She currently does not have a primary care physician.  No past medical history on file.  Home Medications Prior to Admission medications    Medication Sig Start Date End Date Taking? Authorizing Provider  dicyclomine (BENTYL) 20 MG tablet Take 1 tablet (20 mg total) by mouth 2 (two) times daily. 09/09/21  Yes Gareth Morgan, MD  ondansetron (ZOFRAN-ODT) 4 MG disintegrating tablet Take 1 tablet (4 mg total) by mouth every 8 (eight) hours as needed for nausea or vomiting. 09/09/21  Yes Gareth Morgan, MD  celecoxib (CELEBREX) 200 MG capsule Take 1 capsule (200 mg total) by mouth 2 (two) times daily. 04/08/21   Harris, Abigail, PA-C  cephALEXin (KEFLEX) 500 MG capsule Take 1 capsule (500 mg total) by mouth 4 (four) times daily. 03/14/21   Muthersbaugh, Jarrett Soho, PA-C  diclofenac Sodium (VOLTAREN) 1 % GEL Apply 2 g topically 4 (four) times daily. 03/15/21   Kommor, Madison, MD  docusate sodium (COLACE) 100 MG capsule Take 1 capsule (100 mg total) by mouth every 12 (twelve) hours. 06/18/21   Etta Quill, NP  hydrocortisone (ANUSOL-HC) 25 MG suppository Place 1 suppository (25 mg total) rectally 2 (two) times daily. For 7 days 06/18/21   Etta Quill, NP  polyethylene glycol (MIRALAX / GLYCOLAX) 17 g packet Take 17 g by mouth daily. 06/18/21   Etta Quill, NP      Allergies    Patient has no known allergies.    Review of Systems   Review of Systems  Physical Exam Updated Vital Signs BP Marland Kitchen)  179/86 (BP Location: Left Arm)    Pulse 71    Temp 98.3 F (36.8 C) (Oral)    Resp 18    LMP 07/11/2021 (Approximate)    SpO2 100%  Physical Exam Vitals and nursing note reviewed.  Constitutional:      General: She is not in acute distress.    Appearance: She is well-developed. She is not diaphoretic.  HENT:     Head: Normocephalic and atraumatic.  Eyes:     Conjunctiva/sclera: Conjunctivae normal.  Cardiovascular:     Rate and Rhythm: Normal rate and regular rhythm.     Heart sounds: Normal heart sounds. No murmur heard.   No friction rub. No gallop.  Pulmonary:     Effort: Pulmonary effort is normal. No respiratory distress.     Breath  sounds: Normal breath sounds. No wheezing or rales.  Abdominal:     General: There is no distension.     Palpations: Abdomen is soft.     Tenderness: There is abdominal tenderness (mild suprapubic, mild rlq). There is no guarding.  Genitourinary:    Adnexa:        Left: Tenderness (left knee, suprerior to knee) present.   Musculoskeletal:        General: No tenderness.     Cervical back: Normal range of motion.  Skin:    General: Skin is warm and dry.     Findings: No erythema or rash.  Neurological:     Mental Status: She is alert and oriented to person, place, and time.    ED Results / Procedures / Treatments   Labs (all labs ordered are listed, but only abnormal results are displayed) Labs Reviewed  URINALYSIS, ROUTINE W REFLEX MICROSCOPIC - Abnormal; Notable for the following components:      Result Value   Leukocytes,Ua TRACE (*)    All other components within normal limits  URINALYSIS, MICROSCOPIC (REFLEX) - Abnormal; Notable for the following components:   Bacteria, UA RARE (*)    All other components within normal limits  LIPASE, BLOOD  COMPREHENSIVE METABOLIC PANEL  CBC  I-STAT BETA HCG BLOOD, ED ([facility], [facility], [facility] ONLY)    EKG None  Radiology No results found.  Procedures Procedures    Medications Ordered in ED Medications - No data to display  ED Course/ Medical Decision Making/ A&P     Very pleasant 40 year old female presents with concern for abdominal pain, and left leg pain. DDx includes appendicitis, pancreatitis, cholecystitis, pyelonephritis, nephrolithiasis, diverticulitis, PID, ovarian torsion, ectopic pregnancy, and tuboovarian abscess.  Labs obtained in the emergency department were personally reviewed by me and show a negative pregnancy test, normal lipase without signs of pancreatitis, CMP without signs of hepatitis, CBC without signs of anemia, no leukocytosis, and urinalysis without signs of urinary tract infection.  Discussed that given no  leukocytosis, no fever, benign exam, and duration of symptoms I have low suspicion for acute appendicitis, acute diverticulitis, TOA, ovarian torsion, nephrolithiasis or acute cholecystitis.  Discussed possibility of imaging with patient, however at this time given low suspicion for emergent pathology we agreed to supportive care, strict return precautions and primary care physician follow-up.  She has had the leg pain chronically over years, has had prior x-ray completed, clinically I have low suspicion for DVT, she has normal pulses bilaterally no sign of acute arterial thrombus, no sign of cellulitis.  I do not see signs of a surgical emergency or indication for hospital admission at this time.  Placed order  for TOC to help with finding primary care physician.  Given prescription for Zofran and Bentyl. Patient discharged in stable condition with understanding of reasons to return.         Final Clinical Impression(s) / ED Diagnoses Final diagnoses:  Right lower quadrant abdominal pain  Abdominal pain, unspecified abdominal location  Chronic pain of left knee    Rx / DC Orders ED Discharge Orders          Ordered    ondansetron (ZOFRAN-ODT) 4 MG disintegrating tablet  Every 8 hours PRN        09/09/21 1245    dicyclomine (BENTYL) 20 MG tablet  2 times daily        09/09/21 1245              Gareth Morgan, MD 09/09/21 1736

## 2021-09-09 NOTE — ED Triage Notes (Signed)
Pt reports generalized abdominal pain x 2 weeks, 4 days of n/v/d. Reports no menstrual period in 2 months and wonders if she is pregnant. Also reports L leg swelling just above her knee with pain that radiates from top of leg to her foot.

## 2021-10-23 ENCOUNTER — Other Ambulatory Visit: Payer: Self-pay

## 2021-10-23 ENCOUNTER — Emergency Department (HOSPITAL_COMMUNITY)
Admission: EM | Admit: 2021-10-23 | Discharge: 2021-10-24 | Disposition: A | Discharge status: Home / Self Care | Payer: Self-pay | Attending: Emergency Medicine | Admitting: Emergency Medicine

## 2021-10-23 ENCOUNTER — Encounter (HOSPITAL_COMMUNITY): Payer: Self-pay | Admitting: Emergency Medicine

## 2021-10-23 DIAGNOSIS — M79645 Pain in left finger(s): Secondary | ICD-10-CM | POA: Insufficient documentation

## 2021-10-23 DIAGNOSIS — L03012 Cellulitis of left finger: Secondary | ICD-10-CM | POA: Insufficient documentation

## 2021-10-23 MED ORDER — CEPHALEXIN 250 MG PO CAPS
500.0000 mg | ORAL_CAPSULE | Freq: Once | ORAL | Status: AC
  Administered 2021-10-24: 500 mg via ORAL
  Filled 2021-10-23: qty 2

## 2021-10-23 MED ORDER — CEPHALEXIN 500 MG PO CAPS: 500.0000 mg | ORAL_CAPSULE | Freq: Four times a day (QID) | ORAL | 0 refills | Status: DC

## 2021-10-23 NOTE — ED Provider Notes (Signed)
?[facility] EMERGENCY DEPARTMENT ?Provider Note ? ? ?CSN: [id number] ?Arrival date & time: 10/23/21  2027 ? ?  ? ?History ? ?Chief Complaint  ?Patient presents with  ? Finger Injury  ? ? ?Courtney Gutierrez is a 41 y.o. female. ? ?Patient is a 41 year old female with no significant past medical history.  Patient presenting with complaints of left index finger pain and swelling.  This has been worsening over the past several days.  She denies any specific injury or trauma, but does report she bites her nails.  No treatment tried at home. ? ?The history is provided by the patient.  ? ?  ? ?Home Medications ?Prior to Admission medications   ?Medication Sig Start Date End Date Taking? Authorizing Provider  ?celecoxib (CELEBREX) 200 MG capsule Take 1 capsule (200 mg total) by mouth 2 (two) times daily. 04/08/21   Arthor Captain, PA-C  ?cephALEXin (KEFLEX) 500 MG capsule Take 1 capsule (500 mg total) by mouth 4 (four) times daily. 03/14/21   Muthersbaugh, Dahlia Client, PA-C  ?diclofenac Sodium (VOLTAREN) 1 % GEL Apply 2 g topically 4 (four) times daily. 03/15/21   Kommor, Madison, MD  ?dicyclomine (BENTYL) 20 MG tablet Take 1 tablet (20 mg total) by mouth 2 (two) times daily. 09/09/21   Alvira Monday, MD  ?docusate sodium (COLACE) 100 MG capsule Take 1 capsule (100 mg total) by mouth every 12 (twelve) hours. 06/18/21   Felicie Morn, NP  ?hydrocortisone (ANUSOL-HC) 25 MG suppository Place 1 suppository (25 mg total) rectally 2 (two) times daily. For 7 days 06/18/21   Felicie Morn, NP  ?ondansetron (ZOFRAN-ODT) 4 MG disintegrating tablet Take 1 tablet (4 mg total) by mouth every 8 (eight) hours as needed for nausea or vomiting. 09/09/21   Alvira Monday, MD  ?polyethylene glycol (MIRALAX / GLYCOLAX) 17 g packet Take 17 g by mouth daily. 06/18/21   Felicie Morn, NP  ?   ? ?Allergies    ?Patient has no known allergies.   ? ?Review of Systems   ?Review of Systems  ?All other systems reviewed and are negative. ? ?Physical  Exam ?Updated Vital Signs ?BP 118/73 (BP Location: Left Arm)   Pulse 75   Temp 98.5 ?F (36.9 ?C) (Oral)   Resp 15   Ht 5\' 6"  (1.676 m)   Wt 113.4 kg   SpO2 100%   BMI 40.35 kg/m?  ?Physical Exam ?Vitals and nursing note reviewed.  ?Constitutional:   ?   Appearance: Normal appearance.  ?HENT:  ?   Head: Normocephalic and atraumatic.  ?Pulmonary:  ?   Effort: Pulmonary effort is normal.  ?Skin: ?   General: Skin is warm and dry.  ?   Comments: There is swelling, pain, and tenderness to the soft tissues surrounding the base of the nail consistent with a paronychia.  ?Neurological:  ?   Mental Status: She is alert.  ? ? ?ED Results / Procedures / Treatments   ?Labs ?(all labs ordered are listed, but only abnormal results are displayed) ?Labs Reviewed - No data to display ? ?EKG ?None ? ?Radiology ?No results found. ? ?Procedures ?Procedures  ? ? ?Medications Ordered in ED ?Medications  ?cephALEXin (KEFLEX) capsule 500 mg (has no administration in time range)  ? ? ?ED Course/ Medical Decision Making/ A&P ? ?Patient presenting here with complaints of left index finger pain and swelling consistent with paronychia.  Patient advised that incision and drainage is the proper course of action, however refuses to let me do  this.  Patient would like to try antibiotics and warm compresses first.  She will be prescribed Keflex and advised to soak her finger is much as she can.  To return as needed. ? ?Final Clinical Impression(s) / ED Diagnoses ?Final diagnoses:  ?None  ? ? ?Rx / DC Orders ?ED Discharge Orders   ? ? None  ? ?  ? ? ?  ?Geoffery Lyons, MD ?10/23/21 2348 ? ?

## 2021-10-23 NOTE — ED Provider Triage Note (Signed)
Emergency Medicine Provider Triage Evaluation Note ? ?Courtney Gutierrez , a 41 y.o. female  was evaluated in triage.  Pt complains of left index finger pain and swelling around the nailbed.  States this has been going on for 2 weeks.  Denies fever, chills, drainage from the site.  States she bites her nails and thinks that is why this started.  Denies any injury to her finger.  Has full range of motion. ? ?Review of Systems  ?Positive: As above ?Negative: As above ? ?Physical Exam  ?BP 118/73 (BP Location: Left Arm)   Pulse 75   Temp 98.5 ?F (36.9 ?C) (Oral)   Resp 15   Ht 5\' 6"  (1.676 m)   Wt 113.4 kg   SpO2 100%   BMI 40.35 kg/m?  ?Gen:   Awake, no distress   ?Resp:  Normal effort  ?MSK:   Moves extremities without difficulty  ?Other:  Area of swelling, erythema, and tenderness surrounding the left index finger.  Consistent with paronychia ? ?Medical Decision Making  ?Medically screening exam initiated at 9:52 PM.  Appropriate orders placed.  Laurita Peron was informed that the remainder of the evaluation will be completed by another provider, this initial triage assessment does not replace that evaluation, and the importance of remaining in the ED until their evaluation is complete. ? ? ?  ?Courtney Inches, PA-C ?10/23/21 2153 ? ?

## 2021-10-23 NOTE — ED Triage Notes (Signed)
Pt c/o left pointer finger infection x 2 weeks. She thinks it started because she bites her nails.  ?

## 2021-10-23 NOTE — Discharge Instructions (Signed)
Begin taking Keflex as prescribed. ? ?Apply warm compresses as frequently as possible for the next several days. ? ?Return to the emergency department for incision and drainage if symptoms are not improving in the next few days, or if symptoms worsen. ?

## 2022-01-08 ENCOUNTER — Other Ambulatory Visit: Payer: Self-pay

## 2022-01-08 ENCOUNTER — Emergency Department (HOSPITAL_COMMUNITY)
Admission: EM | Admit: 2022-01-08 | Discharge: 2022-01-09 | Disposition: A | Discharge status: Home / Self Care | Payer: Self-pay | Attending: Emergency Medicine | Admitting: Emergency Medicine

## 2022-01-08 ENCOUNTER — Encounter (HOSPITAL_COMMUNITY): Payer: Self-pay

## 2022-01-08 DIAGNOSIS — R102 Pelvic and perineal pain: Secondary | ICD-10-CM

## 2022-01-08 DIAGNOSIS — N3090 Cystitis, unspecified without hematuria: Secondary | ICD-10-CM

## 2022-01-08 DIAGNOSIS — N939 Abnormal uterine and vaginal bleeding, unspecified: Secondary | ICD-10-CM | POA: Insufficient documentation

## 2022-01-08 DIAGNOSIS — R319 Hematuria, unspecified: Secondary | ICD-10-CM | POA: Insufficient documentation

## 2022-01-08 LAB — BASIC METABOLIC PANEL
Anion gap: 3 — ABNORMAL LOW (ref 5–15)
BUN: 9 mg/dL (ref 6–20)
CO2: 24 mmol/L (ref 22–32)
Calcium: 8.8 mg/dL — ABNORMAL LOW (ref 8.9–10.3)
Chloride: 114 mmol/L — ABNORMAL HIGH (ref 98–111)
Creatinine, Ser: 0.66 mg/dL (ref 0.44–1.00)
GFR, Estimated: >60 mL/min (ref >60)
Glucose, Bld: 125 mg/dL — ABNORMAL HIGH (ref 70–99)
Potassium: 3.7 mmol/L (ref 3.5–5.1)
Sodium: 141 mmol/L (ref 135–145)

## 2022-01-08 LAB — CBC WITH DIFFERENTIAL/PLATELET
Abs Immature Granulocytes: 0.03 10*3/uL (ref 0.00–0.07)
Basophils Absolute: 0 10*3/uL (ref 0.0–0.1)
Basophils Relative: 0 %
Eosinophils Absolute: 0.5 10*3/uL (ref 0.0–0.5)
Eosinophils Relative: 6 %
HCT: 39.8 % (ref 36.0–46.0)
Hemoglobin: 13 g/dL (ref 12.0–15.0)
Immature Granulocytes: 0 %
Lymphocytes Relative: 31 %
Lymphs Abs: 2.6 10*3/uL (ref 0.7–4.0)
MCH: 27.5 pg (ref 26.0–34.0)
MCHC: 32.7 g/dL (ref 30.0–36.0)
MCV: 84.1 fL (ref 80.0–100.0)
Monocytes Absolute: 0.6 10*3/uL (ref 0.1–1.0)
Monocytes Relative: 7 %
Neutro Abs: 4.7 10*3/uL (ref 1.7–7.7)
Neutrophils Relative %: 56 %
Platelets: 234 10*3/uL (ref 150–400)
RBC: 4.73 MIL/uL (ref 3.87–5.11)
RDW: 14.2 % (ref 11.5–15.5)
WBC: 8.4 10*3/uL (ref 4.0–10.5)
nRBC: 0 % (ref 0.0–0.2)

## 2022-01-08 LAB — I-STAT BETA HCG BLOOD, ED (MC, WL, AP ONLY): I-stat hCG, quantitative: <5 m[IU]/mL (ref <5)

## 2022-01-08 NOTE — ED Provider Notes (Signed)
Care assumed from Dr. Rush Landmark, patient with urinalysis pending urinalysis. Other labs are unremarkable.  Urinalysis does have 11-20 WBCs, will treat for urinary tract infection with nitrofurantoin.  Results for orders placed or performed during the hospital encounter of 01/08/22  CBC with Differential  Result Value Ref Range   WBC 8.4 4.0 - 10.5 K/uL   RBC 4.73 3.87 - 5.11 MIL/uL   Hemoglobin 13.0 12.0 - 15.0 g/dL   HCT 67.6 72.0 - 94.7 %   MCV 84.1 80.0 - 100.0 fL   MCH 27.5 26.0 - 34.0 pg   MCHC 32.7 30.0 - 36.0 g/dL   RDW 09.6 28.3 - 66.2 %   Platelets 234 150 - 400 K/uL   nRBC 0.0 0.0 - 0.2 %   Neutrophils Relative % 56 %   Neutro Abs 4.7 1.7 - 7.7 K/uL   Lymphocytes Relative 31 %   Lymphs Abs 2.6 0.7 - 4.0 K/uL   Monocytes Relative 7 %   Monocytes Absolute 0.6 0.1 - 1.0 K/uL   Eosinophils Relative 6 %   Eosinophils Absolute 0.5 0.0 - 0.5 K/uL   Basophils Relative 0 %   Basophils Absolute 0.0 0.0 - 0.1 K/uL   Immature Granulocytes 0 %   Abs Immature Granulocytes 0.03 0.00 - 0.07 K/uL  Basic metabolic panel  Result Value Ref Range   Sodium 141 135 - 145 mmol/L   Potassium 3.7 3.5 - 5.1 mmol/L   Chloride 114 (H) 98 - 111 mmol/L   CO2 24 22 - 32 mmol/L   Glucose, Bld 125 (H) 70 - 99 mg/dL   BUN 9 6 - 20 mg/dL   Creatinine, Ser 9.47 0.44 - 1.00 mg/dL   Calcium 8.8 (L) 8.9 - 10.3 mg/dL   GFR, Estimated >65 >46 mL/min   Anion gap 3 (L) 5 - 15  Urinalysis, Routine w reflex microscopic Urine, Clean Catch  Result Value Ref Range   Color, Urine YELLOW YELLOW   APPearance HAZY (A) CLEAR   Specific Gravity, Urine 1.021 1.005 - 1.030   pH 6.0 5.0 - 8.0   Glucose, UA NEGATIVE NEGATIVE mg/dL   Hgb urine dipstick LARGE (A) NEGATIVE   Bilirubin Urine NEGATIVE NEGATIVE   Ketones, ur NEGATIVE NEGATIVE mg/dL   Protein, ur 30 (A) NEGATIVE mg/dL   Nitrite NEGATIVE NEGATIVE   Leukocytes,Ua TRACE (A) NEGATIVE   RBC / HPF 0-5 0 - 5 RBC/hpf   WBC, UA 11-20 0 - 5 WBC/hpf   Bacteria,  UA RARE (A) NONE SEEN   Squamous Epithelial / LPF 6-10 0 - 5   Mucus PRESENT    Hyaline Casts, UA PRESENT   I-Stat Beta hCG blood, ED ([facility], [facility], [facility] only)  Result Value Ref Range   I-stat hCG, quantitative <5.0 <5 mIU/mL   Comment 3          Type and screen [facility]  Result Value Ref Range   ABO/RH(D) O POS    Antibody Screen NEG    Sample Expiration      01/11/2022,2359 Performed at [facility], [address], [place], [place] [address]    No results found.    Dione Booze, MD 01/09/22 [phone]

## 2022-01-08 NOTE — ED Provider Triage Note (Signed)
Emergency Medicine Provider Triage Evaluation Note  Courtney Gutierrez , a 41 y.o. female  was evaluated in triage.  Pt complains of vaginal bleeding, back pain with radicular symptoms down right leg.  Patient states the vaginal bleeding began last night when she is urinating.  She states she has not had a menstrual period in over 7 years.  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  She still has intact uterus.  She said bleeding began with spotting last night but now involves clots.  She denies vaginal discharge, dysuria, hematuria urinary urgency/frequency.  She also endorses suprapubic/midline pelvic pain.  Patient has chronic back pain but states this back pain is worse.  Pain is described as dull and aching and sharp with movement and bilateral nature.  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  Pain is exacerbated with movement and relieved with rest.  Radicular symptoms described as sharp electric-like pain shooting down her right leg past her knee.  Increasing back pain also occurred yesterday after work.  She denies fever, chills, night sweats, chest pain, shortness of breath, nausea/vomiting/diarrhea, urinary symptoms, change in bowel habits.  Denies IV drug use, history of cancer, recent steroid use, saddle anesthesia, bowel/bladder dysfunction.  Review of Systems  Positive: See above Negative:   Physical Exam  BP (!) 151/98 (BP Location: Right Arm)   Pulse 74   Temp 98.3 F (36.8 C) (Oral)   Resp 18   Ht 5' 5.5" (1.664 m)   Wt 110.7 kg   SpO2 95%   BMI 39.99 kg/m  Gen:   Awake, no distress   Resp:  Normal effort  MSK:   Moves extremities without difficulty  Other:  Suprapubic/midline pelvic tenderness.  No overlying skin abnormalities.  CVA tenderness bilaterally.  No midline tenderness of lumbar spine.  Straight leg raise positive on the right.  Medical Decision Making   Medically screening exam initiated at 9:44 PM.  Appropriate orders placed.  Courtney Gutierrez was informed that the remainder of the evaluation will be completed by another provider, this initial triage assessment does not replace that evaluation, and the importance of remaining in the ED until their evaluation is complete.     Peter Garter, [place] 01/08/22 2150

## 2022-01-08 NOTE — ED Provider Notes (Signed)
[facility]-EMERGENCY DEPT Provider Note   CSN: [id number] Arrival date & time: 01/08/22  2115     History  Chief Complaint  Patient presents with   Vaginal Bleeding    Courtney Gutierrez is a 41 y.o. female.  The history is provided by the patient and medical records. No language interpreter was used.  Hematuria This is a new problem. The current episode started 12 to 24 hours ago. The problem occurs constantly. The problem has not changed since onset.Associated symptoms include abdominal pain. Pertinent negatives include no chest pain, no headaches and no shortness of breath. Nothing aggravates the symptoms. Nothing relieves the symptoms. She has tried nothing for the symptoms. The treatment provided no relief.      Home Medications Prior to Admission medications   Medication Sig Start Date End Date Taking? Authorizing Provider  celecoxib (CELEBREX) 200 MG capsule Take 1 capsule (200 mg total) by mouth 2 (two) times daily. 04/08/21   Harris, Abigail, PA-C  cephALEXin (KEFLEX) 500 MG capsule Take 1 capsule (500 mg total) by mouth 4 (four) times daily. 10/23/21   Geoffery Lyons, MD  diclofenac Sodium (VOLTAREN) 1 % GEL Apply 2 g topically 4 (four) times daily. 03/15/21   Kommor, Madison, MD  dicyclomine (BENTYL) 20 MG tablet Take 1 tablet (20 mg total) by mouth 2 (two) times daily. 09/09/21   Alvira Monday, MD  docusate sodium (COLACE) 100 MG capsule Take 1 capsule (100 mg total) by mouth every 12 (twelve) hours. 06/18/21   Felicie Morn, NP  hydrocortisone (ANUSOL-HC) 25 MG suppository Place 1 suppository (25 mg total) rectally 2 (two) times daily. For 7 days 06/18/21   Felicie Morn, NP  ondansetron (ZOFRAN-ODT) 4 MG disintegrating tablet Take 1 tablet (4 mg total) by mouth every 8 (eight) hours as needed for nausea or vomiting. 09/09/21   Alvira Monday, MD  polyethylene glycol (MIRALAX / GLYCOLAX) 17 g packet Take 17 g by mouth daily. 06/18/21   Felicie Morn, NP       Allergies    Patient has no known allergies.    Review of Systems   Review of Systems  Constitutional:  Negative for chills, diaphoresis, fatigue and fever.  Respiratory:  Negative for cough, chest tightness, shortness of breath and wheezing.   Cardiovascular:  Negative for chest pain.  Gastrointestinal:  Positive for abdominal pain and nausea. Negative for abdominal distention, anal bleeding, constipation, diarrhea and vomiting.  Genitourinary:  Positive for flank pain and hematuria. Negative for decreased urine volume, dysuria, frequency, menstrual problem, pelvic pain, vaginal bleeding, vaginal discharge and vaginal pain.  Musculoskeletal:  Positive for back pain. Negative for neck pain and neck stiffness.  Skin:  Negative for rash and wound.  Neurological:  Negative for light-headedness and headaches.  Psychiatric/Behavioral:  Negative for agitation and confusion.   All other systems reviewed and are negative.  Physical Exam Updated Vital Signs BP (!) 151/98 (BP Location: Right Arm)   Pulse 74   Temp 98.3 F (36.8 C) (Oral)   Resp 18   Ht 5' 5.5" (1.664 m)   Wt 110.7 kg   SpO2 95%   BMI 39.99 kg/m  Physical Exam Vitals and nursing note reviewed.  Constitutional:      General: She is not in acute distress.    Appearance: She is well-developed. She is not ill-appearing, toxic-appearing or diaphoretic.  HENT:     Head: Normocephalic and atraumatic.     Nose: No congestion or rhinorrhea.  Mouth/Throat:     Mouth: Mucous membranes are moist.  Eyes:     Conjunctiva/sclera: Conjunctivae normal.  Cardiovascular:     Rate and Rhythm: Normal rate and regular rhythm.     Heart sounds: No murmur heard. Pulmonary:     Effort: Pulmonary effort is normal. No respiratory distress.     Breath sounds: Normal breath sounds. No wheezing, rhonchi or rales.  Chest:     Chest wall: No tenderness.  Abdominal:     General: There is no distension.     Palpations: Abdomen is soft.      Tenderness: There is no abdominal tenderness. There is right CVA tenderness and left CVA tenderness. There is no guarding or rebound.  Musculoskeletal:        General: Tenderness present. No swelling.     Cervical back: Neck supple. No tenderness.     Right lower leg: No edema.     Left lower leg: No edema.  Skin:    General: Skin is warm and dry.     Capillary Refill: Capillary refill takes less than 2 seconds.     Findings: No erythema or rash.  Neurological:     General: No focal deficit present.     Mental Status: She is alert.     Sensory: No sensory deficit.     Motor: No weakness.  Psychiatric:        Mood and Affect: Mood normal.    ED Results / Procedures / Treatments   Labs (all labs ordered are listed, but only abnormal results are displayed) Labs Reviewed  BASIC METABOLIC PANEL - Abnormal; Notable for the following components:      Result Value   Chloride 114 (*)    Glucose, Bld 125 (*)    Calcium 8.8 (*)    Anion gap 3 (*)    All other components within normal limits  URINE CULTURE  CBC WITH DIFFERENTIAL/PLATELET  URINALYSIS, ROUTINE W REFLEX MICROSCOPIC  I-STAT BETA HCG BLOOD, ED ([facility], [facility], [facility] ONLY)  TYPE AND SCREEN    EKG None  Radiology No results found.  Procedures Procedures    Medications Ordered in ED Medications - No data to display  ED Course/ Medical Decision Making/ A&P                           Medical Decision Making Amount and/or Complexity of Data Reviewed Labs: ordered.    Courtney Gutierrez is a 41 y.o. female with a past medical history significant for previous cesarean section who presents with suprapubic pain, bilateral flank pain/back pain, and hematuria.  According to patient, she has not had menstrual cycle in 7 years and last night this morning noticed she was passing some clots.  She initially thought it was vaginal but that after urination suspects it is from her urethra.  She reports gross hematuria several times.  She  reports no actual bleeding, from the vagina itself.  She denies rectal bleeding.  She denies any history of vaginal discharge or pelvic pains.  She reports some mild suprapubic soreness that radiates around both flanks towards her back.  She denies any anterior superior or upper pains in the abdomen.  No vomiting but she does report some nausea.  No chest pain, shortness of breath.  No fevers or chills.  Denies any rashes or trauma.  On exam, lungs clear and chest nontender.  Abdomen was nontender on my exam but she describes  a soreness in her suprapubic area rating around towards the flanks.  She did have mild bilateral CVA tenderness but it was not unilateral.  No midline tenderness.  No rashes.  Good pulses in extremities.  Vital signs reassuring on my initial exam.  Given the patient's urinary changes, suprapubic soreness, and pain rating towards her flank on both sides with some nausea I am concerned patient could have either UTI with some hemorrhagic cystitis as well as possible pyelonephritis.  She will have some screening labs including urinalysis and urine culture.  Given the lack of unilateral sharp pain have less suspicion for a kidney stone and given the lack of abdominal tenderness on exam, low suspicion for something like appendicitis or diverticulitis at this time.  Patient agrees to hold on CT imaging and as her symptoms have improved now she agrees to hold on medications.  We will see what the labs and urinalysis show and treat accordingly.  Anticipate reassessment after urinalysis and labs.  Patient had a CBC that did not show anemia or leukocytosis and she is not pregnant.  Initial metabolic panel shows normal kidney function.  No critical electrolyte abnormality seen.  Anticipate reassessment after urinalysis.   Care transferred to oncoming team to await results of urinalysis.  Clinically I suspect she will have a hemorrhagic cystitis/UTI with possible pyelonephritis given the back  soreness.  If she starts having worsened pain or has concerning findings on work-up, patient may need imaging however at this time, patient agrees to hold on imaging given her otherwise well appearance and improving symptoms.  Given her bleeding being hematuria based on the description and not vaginal bleeding, do not feel that pelvic exam and ultrasound would be as beneficial at this time.  Patient agrees.  Care transferred to oncoming team to await reassessment and work-up completion        Final Clinical Impression(s) / ED Diagnoses Final diagnoses:  Hematuria, unspecified type  Suprapubic pain    Clinical Impression: 1. Hematuria, unspecified type   2. Suprapubic pain     Disposition: Care transferred to oncoming team to await reassessment and work-up completion  This note was prepared with assistance of Dragon voice recognition software. Occasional wrong-word or sound-a-like substitutions may have occurred due to the inherent limitations of voice recognition software.     Deshaun Weisinger, Canary Brim, MD 01/08/22 [id number]

## 2022-01-08 NOTE — ED Triage Notes (Addendum)
Pt presents to ED with c/o heavy vaginal bleeding that began last night. Pt states she has not had a period in 7 years, pt also states she began passing small clots today. Pt endorses abdominal, back, and BLE cramping.

## 2022-01-08 NOTE — ED Notes (Signed)
Provided pt with urine specimen cup

## 2022-01-09 LAB — URINALYSIS, ROUTINE W REFLEX MICROSCOPIC
Bilirubin Urine: NEGATIVE
Glucose, UA: NEGATIVE mg/dL
Ketones, ur: NEGATIVE mg/dL
Nitrite: NEGATIVE
Protein, ur: 30 mg/dL — AB
Specific Gravity, Urine: 1.021 (ref 1.005–1.030)
pH: 6 (ref 5.0–8.0)

## 2022-01-09 LAB — TYPE AND SCREEN
ABO/RH(D): O POS
Antibody Screen: NEGATIVE

## 2022-01-09 MED ORDER — NITROFURANTOIN MONOHYD MACRO 100 MG PO CAPS: 100.0000 mg | ORAL_CAPSULE | Freq: Two times a day (BID) | ORAL | 0 refills | Status: DC

## 2022-01-09 MED ORDER — NITROFURANTOIN MONOHYD MACRO 100 MG PO CAPS
100.0000 mg | ORAL_CAPSULE | Freq: Once | ORAL | Status: AC
Start: 2022-01-09 — End: 2022-01-09
  Administered 2022-01-09: 100 mg via ORAL
  Filled 2022-01-09: qty 1

## 2022-01-10 LAB — URINE CULTURE

## 2022-05-11 ENCOUNTER — Ambulatory Visit (HOSPITAL_COMMUNITY): Payer: Self-pay | Admitting: Mental Health

## 2022-05-11 NOTE — Progress Notes (Deleted)
Psychiatric Initial Adult Assessment  Patient Identification: Courtney Gutierrez MRN:  [id number] Date of Evaluation:  05/11/2022 Referral Source: ***  Assessment:  Courtney Gutierrez is a 41 y.o. female with a history of *** who presents in person to [facility] for initial evaluation of ***.  Patient reports ***  Plan:  # *** Past medication trials:  Status of problem: *** Interventions: -- ***  # *** Past medication trials:  Status of problem: *** Interventions: -- ***  # *** Past medication trials:  Status of problem: *** Interventions: -- ***  Patient was given contact information for behavioral health clinic and was instructed to call 911 for emergencies.   Subjective:  Chief Complaint: No chief complaint on file.   History of Present Illness:  ***  No meds or problems on chart No psych notes   Past Psychiatric History:  Diagnoses: *** Medication trials: *** Previous psychiatrist/therapist: *** Hospitalizations: *** Suicide attempts: *** SIB: *** Hx of violence towards others: *** Current access to guns: *** Hx of abuse: ***  Substance Abuse History in the last 12 months:  {yes no:314532}  Past Medical History: No past medical history on file.  Past Surgical History:  Procedure Laterality Date   CESAREAN SECTION      Family Psychiatric History: ***  Family History: No family history on file.  Social History:   Social History   Socioeconomic History   Marital status: Single    Spouse name: Not on file   Number of children: Not on file   Years of education: Not on file   Highest education level: Not on file  Occupational History   Not on file  Tobacco Use   Smoking status: Former    Types: Cigarettes   Smokeless tobacco: Never  Vaping Use   Vaping Use: Never used  Substance and Sexual Activity   Alcohol use: Yes    Comment: occasionally   Drug use: Never   Sexual activity: Not on file  Other Topics Concern   Not  on file  Social History Narrative   Not on file   Social Determinants of Health   Financial Resource Strain: Not on file  Food Insecurity: Not on file  Transportation Needs: Not on file  Physical Activity: Not on file  Stress: Not on file  Social Connections: Not on file    Additional Social History: ***  Allergies:  No Known Allergies  Current Medications: Current Outpatient Medications  Medication Sig Dispense Refill   nitrofurantoin, macrocrystal-monohydrate, (MACROBID) 100 MG capsule Take 1 capsule (100 mg total) by mouth 2 (two) times daily. X 7 days 14 capsule 0   No current facility-administered medications for this visit.    ROS: Review of Systems  Objective:  Psychiatric Specialty Exam: There were no vitals taken for this visit.There is no height or weight on file to calculate BMI.  General Appearance: {Appearance:22683}  Eye Contact:  {BHH EYE CONTACT:22684}  Speech:  {Speech:22685}  Volume:  {Volume (PAA):22686}  Mood:  {BHH MOOD:22306}  Affect:  {Affect (PAA):22687}  Thought Content: {Thought Content:22690}   Suicidal Thoughts:  {ST/HT (PAA):22692}  Homicidal Thoughts:  {ST/HT (PAA):22692}  Thought Process:  {Thought Process (PAA):22688}  Orientation:  {BHH ORIENTATION (PAA):22689}    Memory:  {BHH MEMORY:22881}  Judgment:  {Judgement (PAA):22694}  Insight:  {Insight (PAA):22695}  Concentration:  {Concentration:21399}  Recall:  {BHH GOOD/FAIR/POOR:22877}  Fund of Knowledge: {BHH GOOD/FAIR/POOR:22877}  Language: {BHH GOOD/FAIR/POOR:22877}  Psychomotor Activity:  {Psychomotor (PAA):22696}  Akathisia:  {BHH  YES OR NO:22294}  AIMS (if indicated): {Desc; done/not:10129}  Assets:  {Assets (PAA):22698}  ADL's:  {BHH NIO'E:70350}  Cognition: {chl bhh cognition:304700322}  Sleep:  {BHH GOOD/FAIR/POOR:22877}   PE: General: well-appearing; no acute distress *** Pulm: no increased work of breathing on room air *** Strength & Muscle Tone: {desc; muscle  tone:32375} Neuro: no focal neurological deficits observed *** Gait & Station: {PE GAIT ED KXFG:18299}  Metabolic Disorder Labs: No results found for: "HGBA1C", "MPG" No results found for: "PROLACTIN" No results found for: "CHOL", "TRIG", "HDL", "CHOLHDL", "VLDL", "LDLCALC" No results found for: "TSH"  Therapeutic Level Labs: No results found for: "LITHIUM" No results found for: "CBMZ" No results found for: "VALPROATE"  Screenings:  Flowsheet Row ED from 01/08/2022 in [place] DEPT ED from 10/23/2021 in [place] ED from 09/09/2021 in [place] No Risk No Risk No Risk       Collaboration of Care: Collaboration of Care: {BH OP Collaboration of Care:21014065}  Patient/Guardian was advised Release of Information must be obtained prior to any record release in order to collaborate their care with an outside provider. Patient/Guardian was advised if they have not already done so to contact the registration department to sign all necessary forms in order for [place] to release information regarding their care.   Consent: Patient/Guardian gives verbal consent for treatment and assignment of benefits for services provided during this visit. Patient/Guardian expressed understanding and agreed to proceed.   A total of *** minutes was spent involved in face to face clinical care, chart review, documentation, and ***.   [place] A Glade Strausser 10/2/202312:18 PM

## 2022-05-12 ENCOUNTER — Ambulatory Visit (HOSPITAL_COMMUNITY): Payer: Self-pay | Admitting: Psychiatry

## 2022-06-10 ENCOUNTER — Emergency Department (HOSPITAL_COMMUNITY)
Admission: EM | Admit: 2022-06-10 | Discharge: 2022-06-10 | Disposition: A | Discharge status: Home / Self Care | Payer: Self-pay

## 2022-06-10 ENCOUNTER — Other Ambulatory Visit: Payer: Self-pay

## 2022-06-10 ENCOUNTER — Emergency Department (HOSPITAL_COMMUNITY)
Admission: EM | Admit: 2022-06-10 | Discharge: 2022-06-10 | Disposition: A | Discharge status: Home / Self Care | Payer: Medicaid Other | Attending: Emergency Medicine | Admitting: Emergency Medicine

## 2022-06-10 ENCOUNTER — Encounter (HOSPITAL_COMMUNITY): Payer: Self-pay | Admitting: Emergency Medicine

## 2022-06-10 DIAGNOSIS — R519 Headache, unspecified: Secondary | ICD-10-CM | POA: Insufficient documentation

## 2022-06-10 DIAGNOSIS — Z20822 Contact with and (suspected) exposure to covid-19: Secondary | ICD-10-CM | POA: Insufficient documentation

## 2022-06-10 DIAGNOSIS — Z5321 Procedure and treatment not carried out due to patient leaving prior to being seen by health care provider: Secondary | ICD-10-CM | POA: Insufficient documentation

## 2022-06-10 DIAGNOSIS — R059 Cough, unspecified: Secondary | ICD-10-CM | POA: Insufficient documentation

## 2022-06-10 LAB — RESP PANEL BY RT-PCR (FLU A&B, COVID) ARPGX2
Influenza A by PCR: NEGATIVE
Influenza B by PCR: NEGATIVE
SARS Coronavirus 2 by RT PCR: NEGATIVE

## 2022-06-10 NOTE — ED Notes (Signed)
Went in room to triage pt, pt not in room.

## 2022-06-10 NOTE — ED Triage Notes (Signed)
Pt reports headache, cough x 2 days. Pt reports she wants to be tested for covid.

## 2023-06-01 IMAGING — CR DG KNEE COMPLETE 4+V*L*
4 series · 4 of 4 positions shown · non-contrast
Comparison: None.

CLINICAL DATA: Knee pain

EXAM:
LEFT KNEE - COMPLETE 4+ VIEW

[t knee ap left]
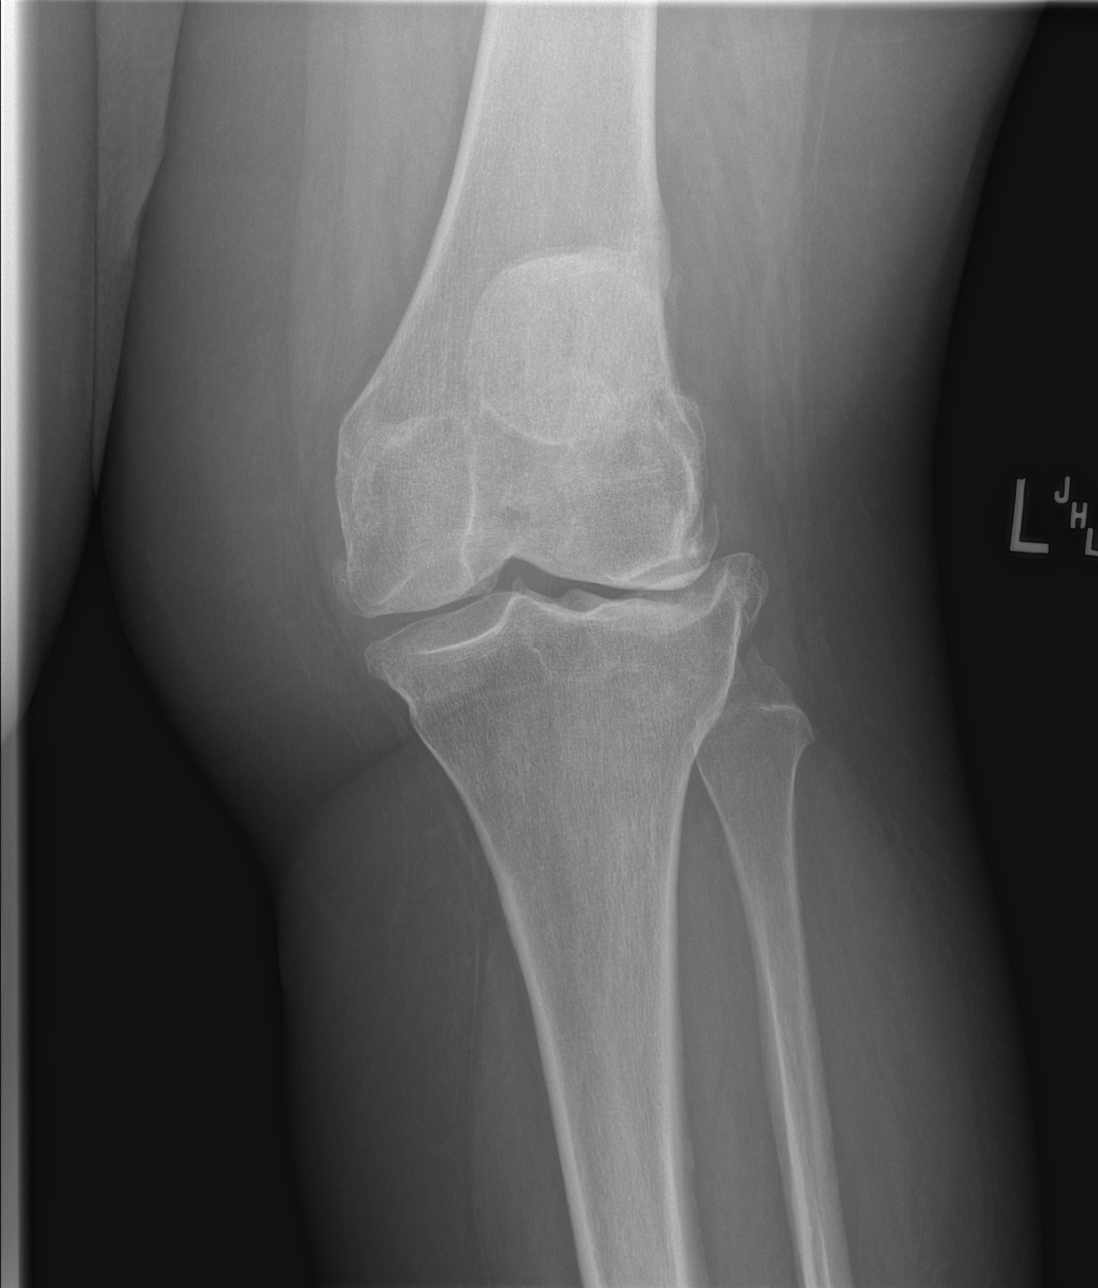

[t knee obl left (1 of 2)]
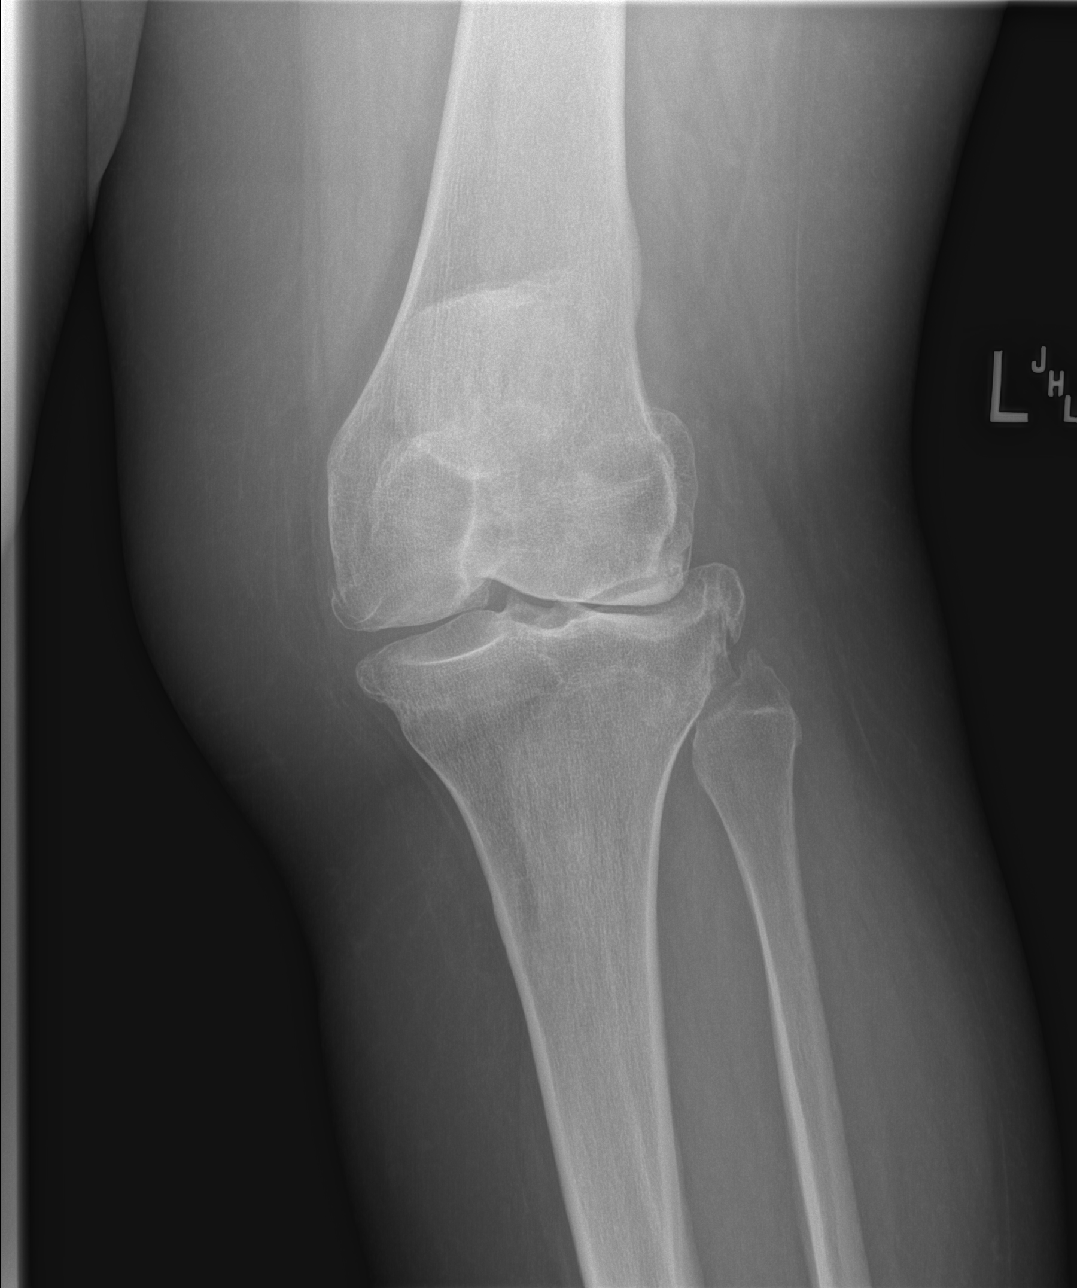

[t knee obl left (2 of 2)]
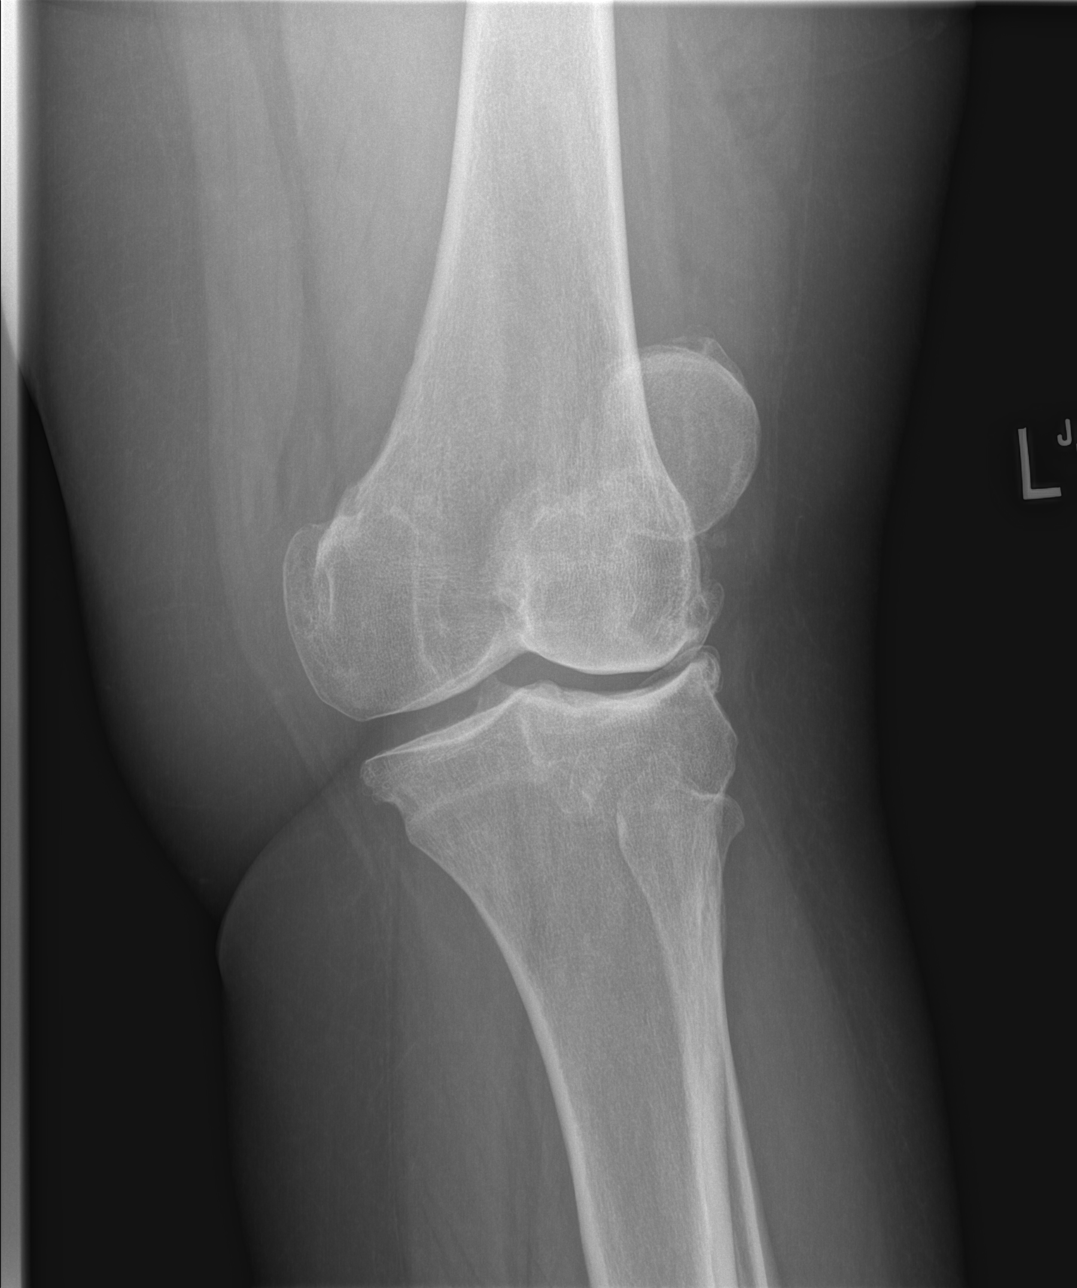

[x knee lat left]
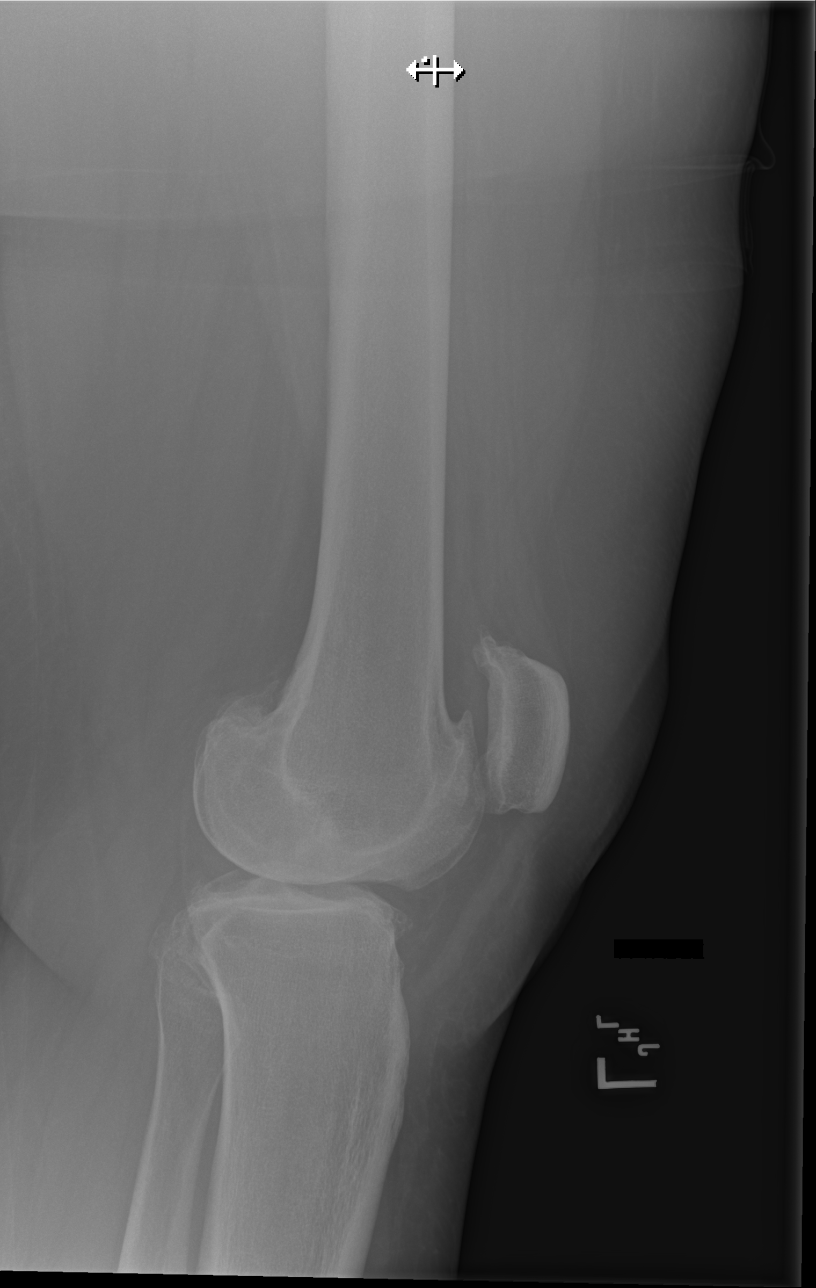

[4 of 4 positions shown; findings below may reference images not displayed]

FINDINGS: No fracture or dislocation is seen.

Mild to moderate degenerative changes, most prominent in the lateral
and patellofemoral compartments.

The visualized soft tissues are unremarkable.

No suprapatellar knee joint effusion.
IMPRESSION: Mild to moderate degenerative changes, as above.

## 2023-06-15 ENCOUNTER — Emergency Department (HOSPITAL_COMMUNITY): Payer: 59

## 2023-06-15 ENCOUNTER — Other Ambulatory Visit: Payer: Self-pay

## 2023-06-15 ENCOUNTER — Emergency Department (HOSPITAL_COMMUNITY)
Admission: EM | Admit: 2023-06-15 | Discharge: 2023-06-15 | Disposition: A | Discharge status: Home / Self Care | Payer: 59

## 2023-06-15 ENCOUNTER — Encounter (HOSPITAL_COMMUNITY): Payer: Self-pay

## 2023-06-15 DIAGNOSIS — K649 Unspecified hemorrhoids: Secondary | ICD-10-CM | POA: Insufficient documentation

## 2023-06-15 DIAGNOSIS — K625 Hemorrhage of anus and rectum: Secondary | ICD-10-CM

## 2023-06-15 DIAGNOSIS — R1032 Left lower quadrant pain: Secondary | ICD-10-CM | POA: Diagnosis present

## 2023-06-15 LAB — CBC WITH DIFFERENTIAL/PLATELET
Abs Immature Granulocytes: 0.03 10*3/uL (ref 0.00–0.07)
Basophils Absolute: 0 10*3/uL (ref 0.0–0.1)
Basophils Relative: 0 %
Eosinophils Absolute: 0.2 10*3/uL (ref 0.0–0.5)
Eosinophils Relative: 2 %
HCT: 39.9 % (ref 36.0–46.0)
Hemoglobin: 12.8 g/dL (ref 12.0–15.0)
Immature Granulocytes: 0 %
Lymphocytes Relative: 29 %
Lymphs Abs: 2.3 10*3/uL (ref 0.7–4.0)
MCH: 26.7 pg (ref 26.0–34.0)
MCHC: 32.1 g/dL (ref 30.0–36.0)
MCV: 83.3 fL (ref 80.0–100.0)
Monocytes Absolute: 0.6 10*3/uL (ref 0.1–1.0)
Monocytes Relative: 8 %
Neutro Abs: 5 10*3/uL (ref 1.7–7.7)
Neutrophils Relative %: 61 %
Platelets: 215 10*3/uL (ref 150–400)
RBC: 4.79 MIL/uL (ref 3.87–5.11)
RDW: 13.5 % (ref 11.5–15.5)
WBC: 8.1 10*3/uL (ref 4.0–10.5)
nRBC: 0 % (ref 0.0–0.2)

## 2023-06-15 LAB — BASIC METABOLIC PANEL
Anion gap: 8 (ref 5–15)
BUN: 7 mg/dL (ref 6–20)
CO2: 24 mmol/L (ref 22–32)
Calcium: 8.6 mg/dL — ABNORMAL LOW (ref 8.9–10.3)
Chloride: 110 mmol/L (ref 98–111)
Creatinine, Ser: 0.86 mg/dL (ref 0.44–1.00)
GFR, Estimated: >60 mL/min (ref >60)
Glucose, Bld: 87 mg/dL (ref 70–99)
Potassium: 3.7 mmol/L (ref 3.5–5.1)
Sodium: 142 mmol/L (ref 135–145)

## 2023-06-15 LAB — HCG, SERUM, QUALITATIVE: Preg, Serum: NEGATIVE

## 2023-06-15 MED ORDER — IOHEXOL 350 MG/ML SOLN
75.0000 mL | Freq: Once | INTRAVENOUS | Status: AC | PRN
  Administered 2023-06-15: 75 mL via INTRAVENOUS

## 2023-06-15 MED ORDER — PRAMOXINE HCL (PERIANAL) 1 % EX FOAM: 1.0000 | Freq: Three times a day (TID) | CUTANEOUS | 0 refills | Status: DC | PRN

## 2023-06-15 NOTE — ED Provider Triage Note (Signed)
Emergency Medicine Provider Triage Evaluation Note  Kanitra Purifoy , a 42 y.o. female  was evaluated in triage.  Pt complains of generalized abdominal pain, hematochezia and possible hemorrhoids that have worsened over the past week.  Reports that last night she noticed blood in her underwear without having to use restroom.  She reports that she is unable to have a full and complete bowel movement and strains frequently.  She denies nausea, vomiting, diarrhea, fevers  Review of Systems  Positive: generalized abdominal pain, hematochezia and possible hemorrhoids Negative: denies nausea, vomiting, diarrhea, fevers  Physical Exam  BP 136/72 (BP Location: Left Arm)   Pulse 70   Temp 98.5 F (36.9 C)   Resp 16   Ht 5' 5.5" (1.664 m)   Wt 110.7 kg   SpO2 100%   BMI 39.99 kg/m  Gen:   Awake, no distress   Resp:  Normal effort  MSK:   Moves extremities without difficulty  Other:    Medical Decision Making  Medically screening exam initiated at 6:14 PM.  Appropriate orders placed.  Jeliyah Middlebrooks was informed that the remainder of the evaluation will be completed by another provider, this initial triage assessment does not replace that evaluation, and the importance of remaining in the ED until their evaluation is complete.  Labs ordered   Judithann Sheen, [place] 06/15/23 1816

## 2023-06-15 NOTE — ED Triage Notes (Signed)
Pt c/o bright red blood in stool and when she wipes. Pt states she believes it's just hemorrhoidsx3d. Pt denies blood thinners. Pt denies dizziness or weakness.

## 2023-06-15 NOTE — ED Provider Notes (Signed)
Courtney Gutierrez EMERGENCY DEPARTMENT AT [facility] Provider Note   CSN: [id number] Arrival date & time: 06/15/23  1546     History  No chief complaint on file.   Courtney Gutierrez is a 42 y.o. female.  42 year old female with no reported past medical history presenting to the emergency department today with rectal bleeding.  The patient states she is also been having some abdominal discomfort.  Reports that the pain is most in the left lower quadrant.  The patient states she has been straining a lot to have bowel movements last few days.  She states that she does think that she has some hemorrhoids.  She has noted some blood on the toilet paper when she wipes.  She states that she is also noticed blood in the toilet bowl.  She came to the emergency department today due to the symptoms.  She is not on any blood thinners.  Denies any chest pain or lightheadedness.  She is here today for further evaluation.        Home Medications Prior to Admission medications   Medication Sig Start Date End Date Taking? Authorizing Provider  nitrofurantoin, macrocrystal-monohydrate, (MACROBID) 100 MG capsule Take 1 capsule (100 mg total) by mouth 2 (two) times daily. X 7 days 01/09/22   Dione Booze, MD      Allergies    Patient has no known allergies.    Review of Systems   Review of Systems  Gastrointestinal:  Positive for abdominal pain and anal bleeding.  All other systems reviewed and are negative.   Physical Exam Updated Vital Signs BP 121/79   Pulse (!) 56   Temp 97.9 F (36.6 C) (Oral)   Resp 18   Ht 5' 5.5" (1.664 m)   Wt 110.7 kg   SpO2 100%   BMI 39.99 kg/m  Physical Exam Vitals and nursing note reviewed.   Gen: NAD Eyes: PERRL, EOMI HEENT: no oropharyngeal swelling Neck: trachea midline Resp: clear to auscultation bilaterally Card: RRR, no murmurs, rubs, or gallops Abd: Tender over the left lower quadrant without guarding or rebound Extremities: no calf  tenderness, no edema Vascular: 2+ radial pulses bilaterally, 2+ DP pulses bilaterally Skin: no rashes Psyc: acting appropriately   ED Results / Procedures / Treatments   Labs (all labs ordered are listed, but only abnormal results are displayed) Labs Reviewed  BASIC METABOLIC PANEL - Abnormal; Notable for the following components:      Result Value   Calcium 8.6 (*)    All other components within normal limits  CBC WITH DIFFERENTIAL/PLATELET  HCG, SERUM, QUALITATIVE    EKG None  Radiology CT ABDOMEN PELVIS W CONTRAST  Result Date: 06/15/2023 CLINICAL DATA:  Diverticulitis, complication suspected. EXAM: CT ABDOMEN AND PELVIS WITH CONTRAST TECHNIQUE: Multidetector CT imaging of the abdomen and pelvis was performed using the standard protocol following bolus administration of intravenous contrast. RADIATION DOSE REDUCTION: This exam was performed according to the departmental dose-optimization program which includes automated exposure control, adjustment of the mA and/or kV according to patient size and/or use of iterative reconstruction technique. CONTRAST:  75mL OMNIPAQUE IOHEXOL 350 MG/ML SOLN COMPARISON:  03/14/2021. FINDINGS: Lower chest: Mild atelectasis or scarring is present at the lung bases. Hepatobiliary: Focal fatty infiltration is noted in the left lobe of the liver adjacent to the falciform ligament. No biliary ductal dilatation. The gallbladder is without stones. Pancreas: Unremarkable. No pancreatic ductal dilatation or surrounding inflammatory changes. Spleen: Normal in size without focal abnormality. Adrenals/Urinary Tract:  The adrenal glands are within normal limits. The kidneys enhance symmetrically. A cyst is noted in the upper pole of the left kidney. No renal calculus or obstructive uropathy bilaterally. The bladder is within normal limits. Stomach/Bowel: Stomach is within normal limits. Appendix appears normal. No evidence of bowel wall thickening, distention, or  inflammatory changes. No free air or pneumatosis. Scattered diverticula are present along the colon without evidence of diverticulitis. Vascular/Lymphatic: No significant vascular findings are present. 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 No enlarged abdominal or pelvic lymph nodes. Reproductive: Uterus and bilateral adnexa are unremarkable. Other: No abdominopelvic ascites. Musculoskeletal: Degenerative changes are present in the thoracolumbar spine. No acute osseous abnormality is seen. IMPRESSION: 1. No acute intra-abdominal process. 2. Diverticulosis without diverticulitis. 3. 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 Electronically Signed   By: Thornell Sartorius M.D.   On: 06/15/2023 22:57    Procedures Procedures    Medications Ordered in ED Medications  iohexol (OMNIPAQUE) 350 MG/ML injection 75 mL (75 mLs Intravenous Contrast Given 06/15/23 2200)    ED Course/ Medical Decision Making/ A&P                                 Medical Decision Making 42 year old female with no reported past medical history presents the emergency department today with rectal bleeding and abdominal pain.  I will further evaluate patient here with basic labs to evaluate for anemia.  Given the patient's left lower quadrant abdominal pain I will obtain a CT scan of her abdomen to evaluate for diverticulitis or diverticulosis.  I will reevaluate for ultimate disposition.  She is currently hemodynamically stable.  The patient is here are stable.  CT scan shows some diverticulosis.  The patient does have some external hemorrhoids noted that are nonbleeding on rectal exam chaperoned by female nursing staff.  Will treat the patient for this.  She does not have any gross blood noted.  She will be discharged with GI follow-up with return precautions.  Amount and/or Complexity of Data  Reviewed Labs: ordered. Radiology: ordered.  Risk Prescription drug management.          Final Clinical Impression(s) / ED Diagnoses Final diagnoses:  Hemorrhoids, unspecified hemorrhoid type  Rectal bleeding    Rx / DC Orders ED Discharge Orders     None         Durwin Glaze, MD 06/15/23 2310

## 2023-06-15 NOTE — Discharge Instructions (Addendum)
Go to the pharmacy and pick up some sitz bath's.  Use these 3 times per day.  Use the Proctofoam as prescribed.  Please call and schedule follow-up appointment with the GI doctors at the number provided.  Start using a fiber supplement such as Metamucil.  Return to the ER for worsening symptoms.

## 2023-07-22 ENCOUNTER — Ambulatory Visit
Admission: EM | Admit: 2023-07-22 | Discharge: 2023-07-22 | Disposition: A | Discharge status: Home / Self Care | Payer: 59 | Attending: Physician Assistant | Admitting: Physician Assistant

## 2023-07-22 DIAGNOSIS — G8929 Other chronic pain: Secondary | ICD-10-CM

## 2023-07-22 MED ORDER — PREDNISONE 20 MG PO TABS: 40.0000 mg | ORAL_TABLET | Freq: Every day | ORAL | 0 refills | Status: AC

## 2023-07-22 NOTE — ED Triage Notes (Addendum)
Pt states left knee pain for more than a year states it radiates down her leg, with swelling on and off. Pt ambulates with  a slow steady gait.

## 2023-10-28 ENCOUNTER — Ambulatory Visit
Admission: RE | Admit: 2023-10-28 | Discharge: 2023-10-28 | Disposition: A | Discharge status: Home / Self Care | Source: Ambulatory Visit | Attending: Family Medicine | Admitting: Family Medicine

## 2023-10-28 VITALS — BP 119/80 | HR 80 | Temp 98.2°F | Resp 16

## 2023-10-28 DIAGNOSIS — G8929 Other chronic pain: Secondary | ICD-10-CM | POA: Diagnosis not present

## 2023-10-28 DIAGNOSIS — M25562 Pain in left knee: Secondary | ICD-10-CM | POA: Diagnosis not present

## 2023-10-28 MED ORDER — MELOXICAM 15 MG PO TABS: 15.0000 mg | ORAL_TABLET | Freq: Every day | ORAL | 0 refills | Status: DC

## 2023-10-28 NOTE — ED Triage Notes (Signed)
 Pt states left knee pain and swelling that is making her whole leg hurt. Pt denies any injury.  Ambulates with a limp. States she used biofreeze at home with no relief.

## 2023-10-28 NOTE — Discharge Instructions (Signed)
 Meloxicam 15 mg--1 daily as needed for pain.  You can use the QR code/website at the back of the summary paperwork to schedule yourself a new patient appointment with primary care

## 2023-10-28 NOTE — ED Provider Notes (Signed)
 [facility]    CSN: [id number] Arrival date & time: 10/28/23  1340      History   Chief Complaint Chief Complaint  Patient presents with   Leg Pain    And also knee and hip pain - Entered by patient    HPI Courtney Gutierrez is a 43 y.o. female.    Leg Pain Here for pain in her left knee.  Is been bothering her for some years but has been worse lately.  No recent trauma.  She is just been trying Biofreeze topically and has not really tried any oral medications for it.  NKDA  She does not have a primary care water.    History reviewed. No pertinent past medical history.  There are no active problems to display for this patient.   Past Surgical History:  Procedure Laterality Date   CESAREAN SECTION      OB History   No obstetric history on file.      Home Medications    Prior to Admission medications   Medication Sig Start Date End Date Taking? Authorizing Provider  meloxicam (MOBIC) 15 MG tablet Take 1 tablet (15 mg total) by mouth daily. 10/28/23  Yes Zenia Resides, MD    Family History History reviewed. No pertinent family history.  Social History Social History   Tobacco Use   Smoking status: Former    Types: Cigarettes   Smokeless tobacco: Never  Vaping Use   Vaping status: Never Used  Substance Use Topics   Alcohol use: Yes    Comment: occasionally   Drug use: Never     Allergies   Patient has no known allergies.   Review of Systems Review of Systems   Physical Exam Triage Vital Signs ED Triage Vitals  Encounter Vitals Group     BP 10/28/23 1345 119/80     Systolic BP Percentile --      Diastolic BP Percentile --      Pulse Rate 10/28/23 1345 80     Resp 10/28/23 1345 16     Temp 10/28/23 1345 98.2 F (36.8 C)     Temp Source 10/28/23 1345 Oral     SpO2 10/28/23 1345 98 %     Weight --      Height --      Head Circumference --      Peak Flow --      Pain Score 10/28/23 1349 8     Pain Loc --      Pain  Education --      Exclude from Growth Chart --    No data found.  Updated Vital Signs BP 119/80 (BP Location: Right Arm)   Pulse 80   Temp 98.2 F (36.8 C) (Oral)   Resp 16   SpO2 98%   Visual Acuity Right Eye Distance:   Left Eye Distance:   Bilateral Distance:    Right Eye Near:   Left Eye Near:    Bilateral Near:     Physical Exam Vitals reviewed.  Constitutional:      General: She is not in acute distress.    Appearance: She is not ill-appearing, toxic-appearing or diaphoretic.  Musculoskeletal:     Comments: There is some tenderness in the left anterior knee with some possible effusion.  Range of motion is normal.  There is some crepitus on range of motion.  There is no edema in the ankle  Skin:    Coloration: Skin is not jaundiced  or pale.  Neurological:     Mental Status: She is alert and oriented to person, place, and time.  Psychiatric:        Behavior: Behavior normal.      UC Treatments / Results  Labs (all labs ordered are listed, but only abnormal results are displayed) Labs Reviewed - No data to display  EKG   Radiology No results found.  Procedures Procedures (including critical care time)  Medications Ordered in UC Medications - No data to display  Initial Impression / Assessment and Plan / UC Course  I have reviewed the triage vital signs and the nursing notes.  Pertinent labs & imaging results that were available during my care of the patient were reviewed by me and considered in my medical decision making (see chart for details).      Mobic sent in to treat this chronic knee pain that most likely has at least some component of arthritis.  X-ray is ordered since it is worsened  She is given contact information for orthopedics  She is given instructions on how to set up primary care appointment on our website.  Final Clinical Impressions(s) / UC Diagnoses   Final diagnoses:  Chronic pain of left knee     Discharge  Instructions      Meloxicam 15 mg--1 daily as needed for pain.  You can use the QR code/website at the back of the summary paperwork to schedule yourself a new patient appointment with primary care       ED Prescriptions     Medication Sig Dispense Auth. Provider   meloxicam (MOBIC) 15 MG tablet Take 1 tablet (15 mg total) by mouth daily. 30 tablet Trentan Trippe, Janace Aris, MD      PDMP not reviewed this encounter.   Zenia Resides, MD 10/28/23 [phone]

## 2023-11-18 ENCOUNTER — Ambulatory Visit: Payer: Self-pay

## 2023-12-07 ENCOUNTER — Ambulatory Visit: Payer: Self-pay

## 2023-12-09 ENCOUNTER — Ambulatory Visit: Payer: Self-pay

## 2024-01-25 ENCOUNTER — Ambulatory Visit (HOSPITAL_COMMUNITY)
Admission: EM | Admit: 2024-01-25 | Discharge: 2024-01-25 | Disposition: A | Discharge status: Home / Self Care | Attending: Family Medicine | Admitting: Family Medicine

## 2024-01-25 ENCOUNTER — Other Ambulatory Visit: Payer: Self-pay

## 2024-01-25 ENCOUNTER — Ambulatory Visit: Payer: Self-pay

## 2024-01-25 ENCOUNTER — Encounter (HOSPITAL_COMMUNITY): Payer: Self-pay | Admitting: *Deleted

## 2024-01-25 DIAGNOSIS — K644 Residual hemorrhoidal skin tags: Secondary | ICD-10-CM | POA: Diagnosis not present

## 2024-01-25 MED ORDER — HYDROCORTISONE (PERIANAL) 2.5 % EX CREA: 1.0000 | TOPICAL_CREAM | Freq: Two times a day (BID) | CUTANEOUS | 0 refills | Status: AC

## 2024-01-25 MED ORDER — AMOXICILLIN-POT CLAVULANATE 875-125 MG PO TABS: 1.0000 | ORAL_TABLET | Freq: Two times a day (BID) | ORAL | 0 refills | Status: AC

## 2024-01-25 MED ORDER — KETOROLAC TROMETHAMINE 30 MG/ML IJ SOLN
INTRAMUSCULAR | Status: AC
  Filled 2024-01-25: qty 1

## 2024-01-25 MED ORDER — KETOROLAC TROMETHAMINE 30 MG/ML IJ SOLN
30.0000 mg | Freq: Once | INTRAMUSCULAR | Status: AC
  Administered 2024-01-25: 30 mg via INTRAMUSCULAR

## 2024-01-25 MED ORDER — KETOROLAC TROMETHAMINE 10 MG PO TABS: 10.0000 mg | ORAL_TABLET | Freq: Four times a day (QID) | ORAL | 0 refills | Status: AC | PRN

## 2024-01-25 NOTE — ED Triage Notes (Signed)
 PT reports fo rpast 2 days the pain has been unbearable .

## 2024-01-25 NOTE — Discharge Instructions (Signed)
 You have been given a shot of Toradol  30 mg today.  Ketorolac  10 mg tablets--take 1 tablet every 6 hours as needed for pain.  This is the same medicine that is in the shot we just gave you  Anusol /hydrocortisone  rectal cream-please apply twice daily to the hemorrhoids until improving.  You can also apply a piece of ice directly to where it is so painful.  Use MiraLAX /polyethylene glycol according to the package instructions, but 1-2 times daily until you are having normal bowel movements.

## 2024-01-25 NOTE — ED Provider Notes (Signed)
 [facility]    CSN: [id number] Arrival date & time: 01/25/24  1046      History   Chief Complaint Chief Complaint  Patient presents with   Hemorrhoids    HPI Courtney Gutierrez is a 43 y.o. female.   HPI Here for pain in her hemorrhoids that bothered her in the last 2 days.  This has been a problem in the past also.  Last normal bowel movement was 2 to 3 days ago.  She feels like she made it worse maybe when she strained having a bowel movement yesterday.  No fever or vomiting.   NKDA Last EGFR was normal.   History reviewed. No pertinent past medical history.  There are no active problems to display for this patient.   Past Surgical History:  Procedure Laterality Date   CESAREAN SECTION      OB History   No obstetric history on file.      Home Medications    Prior to Admission medications   Medication Sig Start Date End Date Taking? Authorizing Provider  amoxicillin-clavulanate (AUGMENTIN) 875-125 MG tablet Take 1 tablet by mouth 2 (two) times daily for 7 days. 01/25/24 02/01/24 Yes Mujtaba Bollig K, MD  hydrocortisone  (ANUSOL -HC) 2.5 % rectal cream Place 1 Application rectally 2 (two) times daily. 01/25/24  Yes Ann Keto, MD  ketorolac  (TORADOL ) 10 MG tablet Take 1 tablet (10 mg total) by mouth every 6 (six) hours as needed (pain). 01/25/24  Yes Ann Keto, MD    Family History History reviewed. No pertinent family history.  Social History Social History   Tobacco Use   Smoking status: Former    Types: Cigarettes   Smokeless tobacco: Never  Vaping Use   Vaping status: Never Used  Substance Use Topics   Alcohol use: Yes    Comment: occasionally   Drug use: Never     Allergies   Patient has no known allergies.   Review of Systems Review of Systems   Physical Exam Triage Vital Signs ED Triage Vitals  Encounter Vitals Group     BP 01/25/24 1107 115/73     Girls Systolic BP Percentile --      Girls Diastolic BP  Percentile --      Boys Systolic BP Percentile --      Boys Diastolic BP Percentile --      Pulse Rate 01/25/24 1107 (!) 59     Resp 01/25/24 1107 20     Temp 01/25/24 1107 98.3 F (36.8 C)     Temp src --      SpO2 01/25/24 1107 96 %     Weight --      Height --      Head Circumference --      Peak Flow --      Pain Score 01/25/24 1105 10     Pain Loc --      Pain Education --      Exclude from Growth Chart --    No data found.  Updated Vital Signs BP 115/73   Pulse (!) 59   Temp 98.3 F (36.8 C)   Resp 20   SpO2 96%   Visual Acuity Right Eye Distance:   Left Eye Distance:   Bilateral Distance:    Right Eye Near:   Left Eye Near:    Bilateral Near:     Physical Exam Vitals reviewed.  Constitutional:      General: She is not in acute  distress.    Appearance: She is not toxic-appearing.  Genitourinary:    Comments: Chaperone present during the time of exam.  There is 1 softer hemorrhoid that is a little tender and then a fuller 1 that is tender at the about 6 o'clock position, and that 1 is about 1.5 cm in diameter.  There is no erythema or skin induration otherwise.  Skin:    Coloration: Skin is not pale.   Neurological:     Mental Status: She is alert and oriented to person, place, and time.   Psychiatric:        Behavior: Behavior normal.      UC Treatments / Results  Labs (all labs ordered are listed, but only abnormal results are displayed) Labs Reviewed - No data to display  EKG   Radiology No results found.  Procedures Procedures (including critical care time)  Medications Ordered in UC Medications  ketorolac  (TORADOL ) 30 MG/ML injection 30 mg (has no administration in time range)    Initial Impression / Assessment and Plan / UC Course  I have reviewed the triage vital signs and the nursing notes.  Pertinent labs & imaging results that were available during my care of the patient were reviewed by me and considered in my medical  decision making (see chart for details).     This does seem to be most consistent with an external hemorrhoid.  Toradol  injection is given here and Toradol  tablets are sent to the pharmacy.  Anusol  cream is sent into apply directly to the hemorrhoids, and I am recommending MiraLAX  use to help with the constipation  I do not think this is truly an abscess or cellulitis, but I am sending in Augmentin in case there is anything like that developing. Final Clinical Impressions(s) / UC Diagnoses   Final diagnoses:  External hemorrhoid     Discharge Instructions      You have been given a shot of Toradol  30 mg today.  Ketorolac  10 mg tablets--take 1 tablet every 6 hours as needed for pain.  This is the same medicine that is in the shot we just gave you  Anusol /hydrocortisone  rectal cream-please apply twice daily to the hemorrhoids until improving.  You can also apply a piece of ice directly to where it is so painful.  Use MiraLAX /polyethylene glycol according to the package instructions, but 1-2 times daily until you are having normal bowel movements.     ED Prescriptions     Medication Sig Dispense Auth. Provider   amoxicillin-clavulanate (AUGMENTIN) 875-125 MG tablet Take 1 tablet by mouth 2 (two) times daily for 7 days. 14 tablet Gwenlyn Hottinger, Paige Boatman, MD   ketorolac  (TORADOL ) 10 MG tablet Take 1 tablet (10 mg total) by mouth every 6 (six) hours as needed (pain). 20 tablet Ann Keto, MD   hydrocortisone  (ANUSOL -HC) 2.5 % rectal cream Place 1 Application rectally 2 (two) times daily. 30 g Ann Keto, MD      PDMP not reviewed this encounter.   Ann Keto, MD 01/25/24 1145

## 2024-02-02 ENCOUNTER — Ambulatory Visit (INDEPENDENT_AMBULATORY_CARE_PROVIDER_SITE_OTHER)

## 2024-02-02 ENCOUNTER — Ambulatory Visit
Admission: RE | Admit: 2024-02-02 | Discharge: 2024-02-02 | Disposition: A | Discharge status: Home / Self Care | Source: Ambulatory Visit | Attending: Family Medicine | Admitting: Family Medicine

## 2024-02-02 ENCOUNTER — Ambulatory Visit: Payer: Self-pay | Admitting: Urgent Care

## 2024-02-02 VITALS — BP 140/90 | HR 71 | Temp 97.8°F | Resp 18

## 2024-02-02 DIAGNOSIS — M1712 Unilateral primary osteoarthritis, left knee: Secondary | ICD-10-CM | POA: Diagnosis not present

## 2024-02-02 DIAGNOSIS — M25552 Pain in left hip: Secondary | ICD-10-CM | POA: Diagnosis not present

## 2024-02-02 DIAGNOSIS — G8929 Other chronic pain: Secondary | ICD-10-CM

## 2024-02-02 DIAGNOSIS — M25562 Pain in left knee: Secondary | ICD-10-CM

## 2024-02-02 MED ORDER — PREDNISONE 20 MG PO TABS: ORAL_TABLET | ORAL | 0 refills | Status: AC

## 2024-02-02 NOTE — ED Triage Notes (Signed)
 Pt states left leg pain for more than a year. States she fell 2 weeks ago and and the pain is worse especially to her knee and her hip.
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
## 2024-05-18 ENCOUNTER — Ambulatory Visit: Payer: Self-pay

## 2024-05-25 ENCOUNTER — Ambulatory Visit (HOSPITAL_COMMUNITY): Payer: Self-pay

## 2024-06-28 ENCOUNTER — Ambulatory Visit: Payer: Self-pay

## 2024-07-01 ENCOUNTER — Ambulatory Visit (HOSPITAL_COMMUNITY): Payer: Self-pay

## 2024-07-02 ENCOUNTER — Encounter (HOSPITAL_COMMUNITY): Payer: Self-pay

## 2024-07-02 ENCOUNTER — Ambulatory Visit (HOSPITAL_COMMUNITY): Admission: EM | Admit: 2024-07-02 | Discharge: 2024-07-02 | Disposition: A | Discharge status: Home / Self Care

## 2024-07-02 ENCOUNTER — Ambulatory Visit (HOSPITAL_COMMUNITY): Payer: Self-pay

## 2024-07-02 NOTE — ED Notes (Signed)
 Call patient from the lobby. No answer
